# Patient Record
Sex: Male | Born: 1980 | Race: Black or African American | Hispanic: No | Marital: Single | Smoking: Current every day smoker
Health system: Southern US, Community
[De-identification: ages and names within clinical notes are randomized; demographics above are authoritative.]

## PROBLEM LIST (undated history)

## (undated) DIAGNOSIS — S86019A Strain of unspecified Achilles tendon, initial encounter: Secondary | ICD-10-CM

## (undated) HISTORY — PX: NO PAST SURGERIES: SHX2092

---

## 2001-12-03 ENCOUNTER — Encounter: Admission: RE | Admit: 2001-12-03 | Discharge: 2001-12-03 | Payer: Self-pay | Admitting: Internal Medicine

## 2001-12-10 ENCOUNTER — Encounter: Admission: RE | Admit: 2001-12-10 | Discharge: 2001-12-10 | Payer: Self-pay | Admitting: Internal Medicine

## 2001-12-10 ENCOUNTER — Ambulatory Visit (HOSPITAL_COMMUNITY): Admission: RE | Admit: 2001-12-10 | Discharge: 2001-12-10 | Payer: Self-pay | Admitting: Internal Medicine

## 2001-12-16 ENCOUNTER — Encounter: Admission: RE | Admit: 2001-12-16 | Discharge: 2001-12-16 | Payer: Self-pay | Admitting: Infectious Diseases

## 2003-01-28 ENCOUNTER — Emergency Department (HOSPITAL_COMMUNITY): Admission: EM | Admit: 2003-01-28 | Discharge: 2003-01-28 | Payer: Self-pay | Admitting: Emergency Medicine

## 2003-08-14 ENCOUNTER — Emergency Department (HOSPITAL_COMMUNITY): Admission: EM | Admit: 2003-08-14 | Discharge: 2003-08-14 | Payer: Self-pay | Admitting: Emergency Medicine

## 2005-01-28 IMAGING — CR DG CERVICAL SPINE COMPLETE 4+V
5 series · 5 of 5 positions shown · non-contrast
Comparison: none

CLINICAL DATA: pain
 CERVICAL SPINE COMPLETE
 There is no evidence of fracture or prevertebral soft tissue swelling. Alignment is normal. The intervertebral disk spaces are within normal limits and no other significant bone abnormalities are identified.

 IMPRESSION
 Negative cervical spine radiographs.

[view not recorded (1 of 5)]
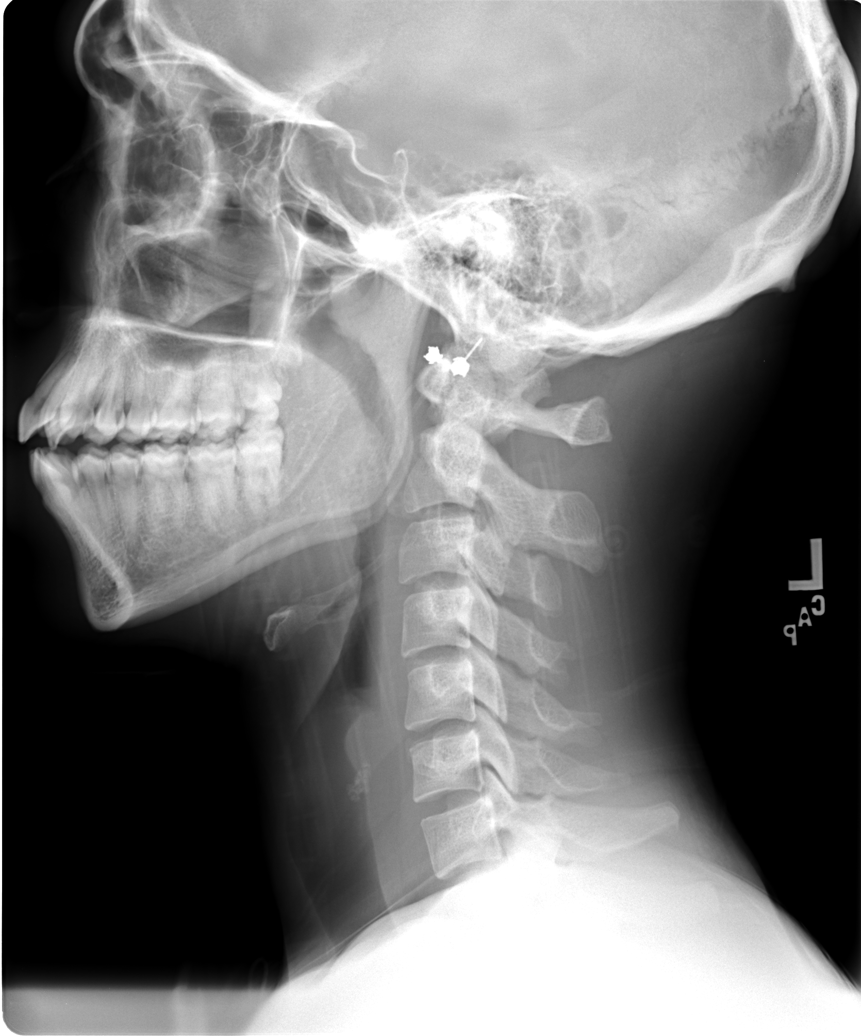

[view not recorded (2 of 5)]
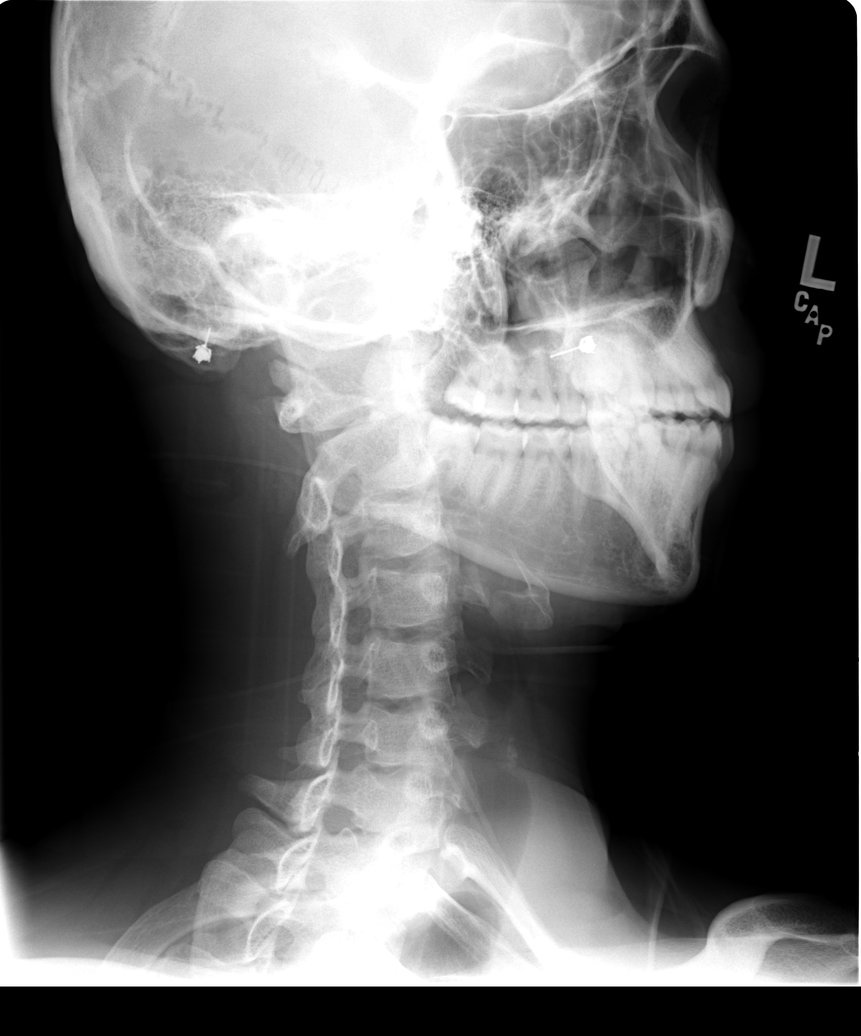

[view not recorded (3 of 5)]
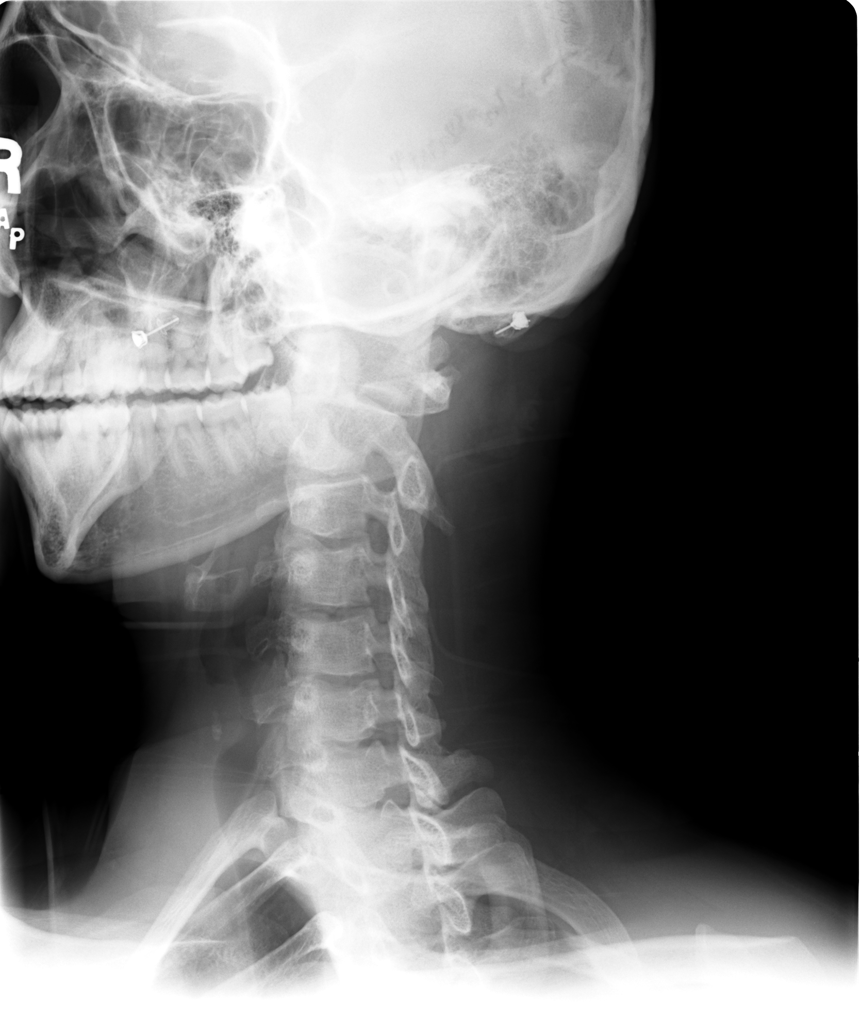

[view not recorded (4 of 5)]
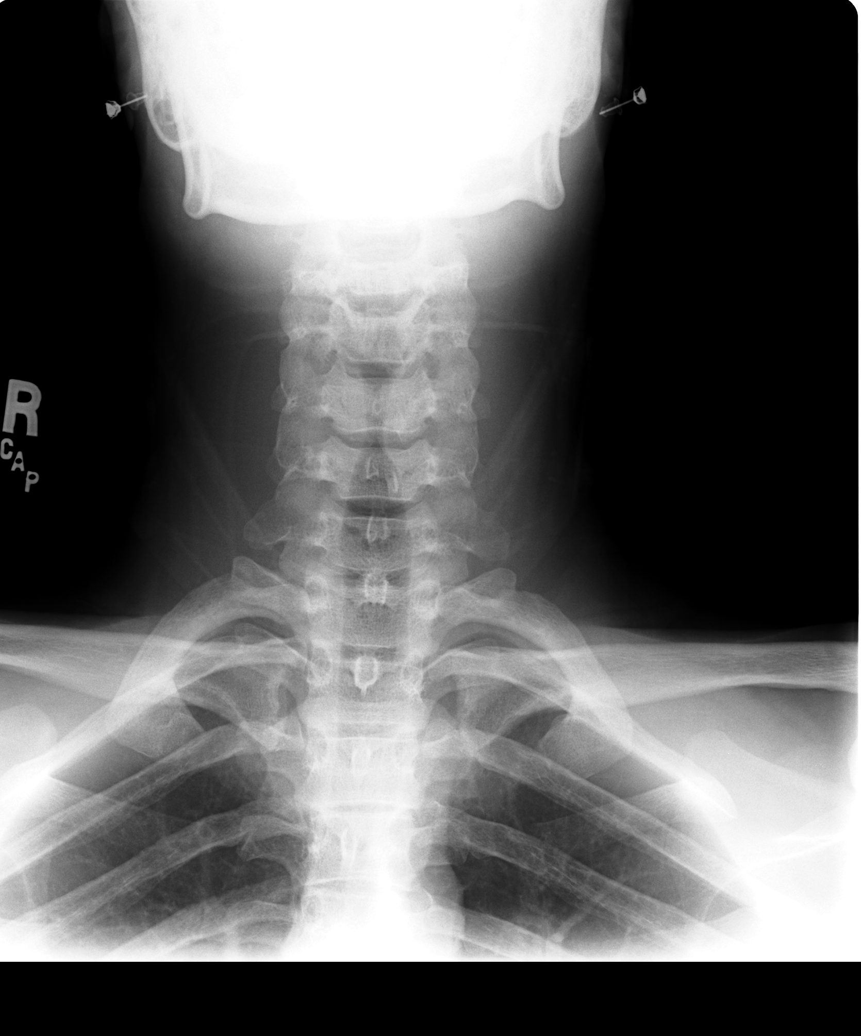

[view not recorded (5 of 5)]
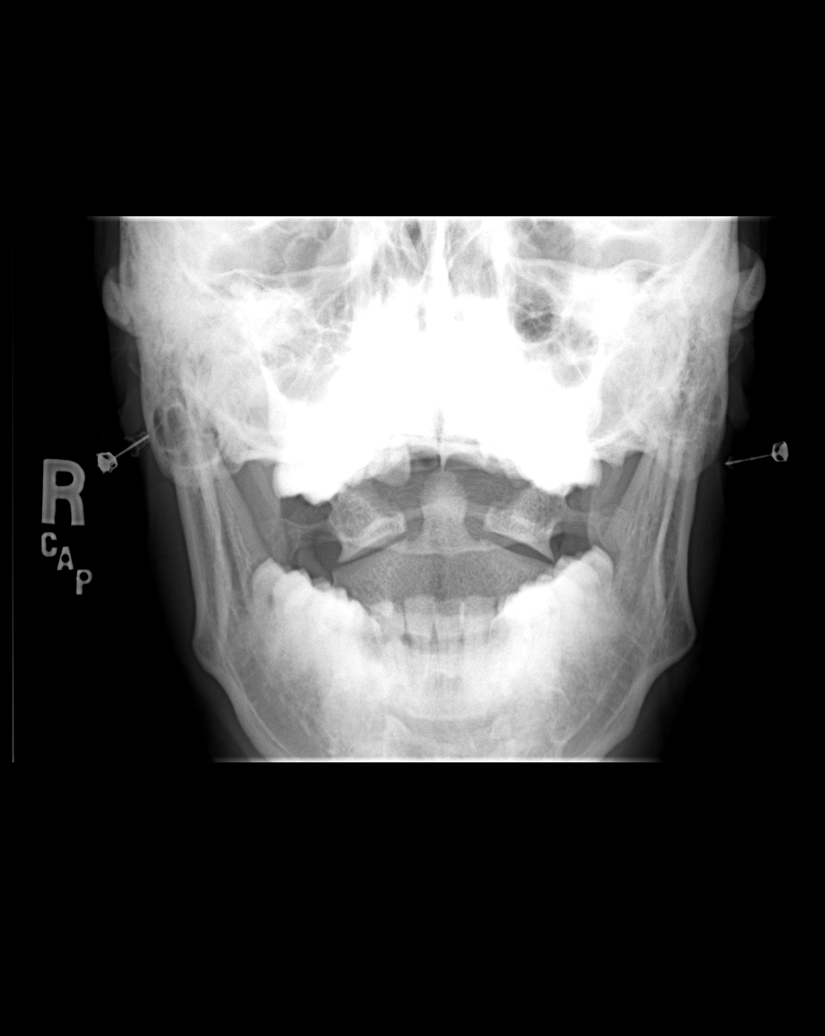

[5 of 5 positions shown; findings below may reference images not displayed]

## 2006-08-16 ENCOUNTER — Emergency Department (HOSPITAL_COMMUNITY): Admission: EM | Admit: 2006-08-16 | Discharge: 2006-08-16 | Payer: Self-pay | Admitting: Emergency Medicine

## 2007-10-24 ENCOUNTER — Emergency Department (HOSPITAL_COMMUNITY): Admission: EM | Admit: 2007-10-24 | Discharge: 2007-10-24 | Payer: Self-pay | Admitting: Emergency Medicine

## 2008-07-16 ENCOUNTER — Emergency Department (HOSPITAL_COMMUNITY): Admission: EM | Admit: 2008-07-16 | Discharge: 2008-07-16 | Payer: Self-pay | Admitting: Family Medicine

## 2010-01-18 ENCOUNTER — Emergency Department (HOSPITAL_COMMUNITY)
Admission: EM | Admit: 2010-01-18 | Discharge: 2010-01-18 | Payer: Self-pay | Source: Home / Self Care | Admitting: Family Medicine

## 2010-04-02 LAB — GC/CHLAMYDIA PROBE AMP, GENITAL
Chlamydia, DNA Probe: NEGATIVE
GC Probe Amp, Genital: POSITIVE — AB

## 2010-04-30 LAB — GC/CHLAMYDIA PROBE AMP, GENITAL
Chlamydia, DNA Probe: NEGATIVE
GC Probe Amp, Genital: POSITIVE — AB

## 2010-04-30 LAB — RPR: RPR Ser Ql: NONREACTIVE

## 2010-04-30 LAB — HIV ANTIBODY (ROUTINE TESTING W REFLEX): HIV: NONREACTIVE

## 2010-10-29 ENCOUNTER — Inpatient Hospital Stay (INDEPENDENT_AMBULATORY_CARE_PROVIDER_SITE_OTHER)
Admission: RE | Admit: 2010-10-29 | Discharge: 2010-10-29 | Disposition: A | Discharge status: Home / Self Care | Payer: Self-pay | Source: Ambulatory Visit | Attending: Family Medicine | Admitting: Family Medicine

## 2010-10-29 DIAGNOSIS — IMO0002 Reserved for concepts with insufficient information to code with codable children: Secondary | ICD-10-CM

## 2012-04-27 ENCOUNTER — Other Ambulatory Visit (HOSPITAL_COMMUNITY)
Admission: RE | Admit: 2012-04-27 | Discharge: 2012-04-27 | Disposition: A | Discharge status: Home / Self Care | Payer: Self-pay | Source: Ambulatory Visit | Attending: Emergency Medicine | Admitting: Emergency Medicine

## 2012-04-27 ENCOUNTER — Encounter (HOSPITAL_COMMUNITY): Payer: Self-pay | Admitting: *Deleted

## 2012-04-27 ENCOUNTER — Emergency Department (INDEPENDENT_AMBULATORY_CARE_PROVIDER_SITE_OTHER)
Admission: EM | Admit: 2012-04-27 | Discharge: 2012-04-27 | Disposition: A | Discharge status: Home / Self Care | Payer: Self-pay | Source: Home / Self Care | Attending: Family Medicine | Admitting: Family Medicine

## 2012-04-27 DIAGNOSIS — F17200 Nicotine dependence, unspecified, uncomplicated: Secondary | ICD-10-CM

## 2012-04-27 DIAGNOSIS — Z113 Encounter for screening for infections with a predominantly sexual mode of transmission: Secondary | ICD-10-CM | POA: Insufficient documentation

## 2012-04-27 DIAGNOSIS — Z720 Tobacco use: Secondary | ICD-10-CM

## 2012-04-27 DIAGNOSIS — Z202 Contact with and (suspected) exposure to infections with a predominantly sexual mode of transmission: Secondary | ICD-10-CM

## 2012-04-27 MED ORDER — METRONIDAZOLE 500 MG PO TABS
2000.0000 mg | ORAL_TABLET | Freq: Once | ORAL | Status: AC
  Administered 2012-04-27: 2000 mg via ORAL

## 2012-04-27 MED ORDER — METRONIDAZOLE 500 MG PO TABS
ORAL_TABLET | ORAL | Status: AC
  Filled 2012-04-27: qty 4

## 2012-04-27 NOTE — ED Provider Notes (Signed)
History     CSN: [id number]  Arrival date & time 04/27/12  1458   First MD Initiated Contact with Patient 04/27/12 1708      Chief Complaint  Patient presents with  . Exposure to STD    (Consider location/radiation/quality/duration/timing/severity/associated sxs/prior treatment) HPI Comments: Pt denies any sx. Expresses interest in smoking cessation  Patient is a 31 y.o. male presenting with STD exposure. The history is provided by the patient.  Exposure to STD This is a new problem. Episode onset: girlfriend found out 3 days ago she was positive for trich. The problem occurs constantly. The problem has not changed since onset.Pertinent negatives include no abdominal pain. Nothing aggravates the symptoms. Nothing relieves the symptoms. He has tried nothing for the symptoms.    History reviewed. No pertinent past medical history.  History reviewed. No pertinent past surgical history.  History reviewed. No pertinent family history.  History  Substance Use Topics  . Smoking status: Current Every Day Smoker -- 0.25 packs/day    Types: Cigarettes  . Smokeless tobacco: Not on file  . Alcohol Use: No      Review of Systems  Constitutional: Negative for fever and chills.  Gastrointestinal: Negative for abdominal pain.  Genitourinary: Negative for dysuria, discharge, genital sores and penile pain.    Allergies  Review of patient's allergies indicates no known allergies.  Home Medications  No current outpatient prescriptions on file.  BP 150/82  Pulse 69  Temp(Src) 98.2 F (36.8 C) (Oral)  Resp 16  SpO2 99%  Physical Exam  Constitutional: He appears well-developed and well-nourished. No distress.  Pulmonary/Chest: Effort normal.    ED Course  Procedures (including critical care time)  Labs Reviewed  URINE CYTOLOGY ANCILLARY ONLY   No results found.   1. Trichomonas exposure   2. Smoking trying to quit       MDM  Exam deferred as pt not symptomatic.   Given one time trich dose of flagyl.  Given smoking cessation info.          Cathlyn Parsons, NP 04/27/12 1715

## 2012-04-27 NOTE — ED Notes (Signed)
Girlfriend was notified that she had [place] on Fri.  She has been treated.  No symptoms.  Had another partner in Nov.  To busy here on Sat. line out the door.

## 2012-04-29 NOTE — ED Provider Notes (Signed)
Medical screening examination/treatment/procedure(s) were performed by resident physician or non-physician practitioner and as supervising physician I was immediately available for consultation/collaboration.   Laurence Crofford DOUGLAS MD.   Zohaib Heeney D Acey Woodfield, MD 04/29/12 1524 

## 2013-05-10 ENCOUNTER — Emergency Department (HOSPITAL_COMMUNITY)
Admission: EM | Admit: 2013-05-10 | Discharge: 2013-05-10 | Discharge status: Absent without leave | Payer: Self-pay | Source: Home / Self Care

## 2013-08-21 DIAGNOSIS — S86019A Strain of unspecified Achilles tendon, initial encounter: Secondary | ICD-10-CM

## 2013-08-21 HISTORY — DX: Strain of unspecified achilles tendon, initial encounter: S86.019A

## 2013-08-23 ENCOUNTER — Emergency Department (HOSPITAL_COMMUNITY)
Admission: EM | Admit: 2013-08-23 | Discharge: 2013-08-23 | Disposition: A | Discharge status: Home / Self Care | Payer: Self-pay | Attending: Emergency Medicine | Admitting: Emergency Medicine

## 2013-08-23 ENCOUNTER — Encounter (HOSPITAL_COMMUNITY): Payer: Self-pay | Admitting: Emergency Medicine

## 2013-08-23 DIAGNOSIS — Y92838 Other recreation area as the place of occurrence of the external cause: Secondary | ICD-10-CM

## 2013-08-23 DIAGNOSIS — S86002A Unspecified injury of left Achilles tendon, initial encounter: Secondary | ICD-10-CM

## 2013-08-23 DIAGNOSIS — S96819A Strain of other specified muscles and tendons at ankle and foot level, unspecified foot, initial encounter: Principal | ICD-10-CM

## 2013-08-23 DIAGNOSIS — W219XXA Striking against or struck by unspecified sports equipment, initial encounter: Secondary | ICD-10-CM | POA: Insufficient documentation

## 2013-08-23 DIAGNOSIS — S93499A Sprain of other ligament of unspecified ankle, initial encounter: Secondary | ICD-10-CM | POA: Insufficient documentation

## 2013-08-23 DIAGNOSIS — F17200 Nicotine dependence, unspecified, uncomplicated: Secondary | ICD-10-CM | POA: Insufficient documentation

## 2013-08-23 DIAGNOSIS — S99929A Unspecified injury of unspecified foot, initial encounter: Secondary | ICD-10-CM

## 2013-08-23 DIAGNOSIS — Y9367 Activity, basketball: Secondary | ICD-10-CM | POA: Insufficient documentation

## 2013-08-23 DIAGNOSIS — S99919A Unspecified injury of unspecified ankle, initial encounter: Secondary | ICD-10-CM

## 2013-08-23 DIAGNOSIS — Y9239 Other specified sports and athletic area as the place of occurrence of the external cause: Secondary | ICD-10-CM | POA: Insufficient documentation

## 2013-08-23 DIAGNOSIS — S8990XA Unspecified injury of unspecified lower leg, initial encounter: Secondary | ICD-10-CM | POA: Insufficient documentation

## 2013-08-23 MED ORDER — IBUPROFEN 600 MG PO TABS: 600.0000 mg | ORAL_TABLET | Freq: Four times a day (QID) | ORAL | Status: DC | PRN

## 2013-08-23 MED ORDER — OXYCODONE-ACETAMINOPHEN 5-325 MG PO TABS
1.0000 | ORAL_TABLET | Freq: Once | ORAL | Status: AC
  Administered 2013-08-23: 1 via ORAL
  Filled 2013-08-23: qty 1

## 2013-08-23 MED ORDER — HYDROCODONE-ACETAMINOPHEN 5-325 MG PO TABS: ORAL_TABLET | ORAL | Status: DC

## 2013-08-23 NOTE — ED Provider Notes (Signed)
Medical screening examination/treatment/procedure(s) were performed by non-physician practitioner and as supervising physician I was immediately available for consultation/collaboration.  Rakiya Krawczyk L Burak Zerbe, MD 08/23/13 1703 

## 2013-08-23 NOTE — Discharge Instructions (Signed)
Please read and follow all provided instructions.  Your diagnoses today include:  1. Achilles tendon injury, left, initial encounter     Tests performed today include:  Vital signs. See below for your results today.   Medications prescribed:   Vicodin (hydrocodone/acetaminophen) - narcotic pain medication  DO NOT drive or perform any activities that require you to be awake and alert because this medicine can make you drowsy. BE VERY CAREFUL not to take multiple medicines containing Tylenol (also called acetaminophen). Doing so can lead to an overdose which can damage your liver and cause liver failure and possibly death.   Ibuprofen (Motrin, Advil) - anti-inflammatory pain medication  Do not exceed 600mg  ibuprofen every 6 hours, take with food  You have been prescribed an anti-inflammatory medication or NSAID. Take with food. Take smallest effective dose for the shortest duration needed for your pain. Stop taking if you experience stomach pain or vomiting.   Take any prescribed medications only as directed.  Home care instructions:   Follow any educational materials contained in this packet  Use crutches and splint at all times  Follow R.I.C.E. Protocol:  R - rest your injury   I  - use ice on injury without applying directly to skin  C - compress injury with bandage or splint  E - elevate the injury as much as possible  Follow-up instructions: Please follow-up with the provided orthopedic physician (bone specialist) this week.   Return instructions:   Please return if your toes are numb or tingling, appear gray or blue, or you have severe pain (also elevate leg and loosen splint or wrap if you were given one)  Please return to the Emergency Department if you experience worsening symptoms.   Please return if you have any other emergent concerns.  Additional Information:  Your vital signs today were: BP 118/63   Pulse 60   Temp(Src) 97.6 F (36.4 C) (Oral)    Resp 20   SpO2 99% If your blood pressure (BP) was elevated above 135/85 this visit, please have this repeated by your doctor within one month. -------------- If prescribed crutches for your injury: use crutches with non-weight bearing for the first few days. Then, you may walk as the pain allows, or as instructed. Start gradually with weight bearing on the affected side. Once you can walk pain free, then try jogging. When you can run forwards, then you can try moving side-to-side. If you cannot walk without crutches in one week, you need a re-check. --------------

## 2013-08-23 NOTE — ED Notes (Signed)
Declined W/C at D/C and was escorted to lobby by RN. 

## 2013-08-23 NOTE — ED Provider Notes (Signed)
CSN: [id number]     Arrival date & time 08/23/13  0840 History  This chart was scribed for non-physician practitioner, Rhea BleacherJosh Valora Norell, PA-C working with Flint MelterElliott L Wentz, MD, by Jarvis Morganaylor Ferguson, ED Scribe. This patient was seen in room TR07C/TR07C and the patient's care was started at 9:09 AM.     Chief Complaint  Patient presents with  . Leg Pain     The history is provided by the patient. No language interpreter was used.   HPI Comments: [name] is a 33 y.o. male who presents to the Emergency Department complaining of pain to left lower leg that began 2 days ago. Patient states he was playing basketball when he feels like he possibly injured it.  He states that another played ran into his heel. Pain began acutely. Patient states that the pain radiates from his left lower heel up to his left knee. He notes some associated tingling in the left achilles and mild swelling. Patient states that the pain is exacerbated by ambulating and movement. He has been using crutches since Saturday and states he has been unable to walk. He denies any numbness to the area.   History reviewed. No pertinent past medical history. History reviewed. No pertinent past surgical history. History reviewed. No pertinent family history. History  Substance Use Topics  . Smoking status: Current Every Day Smoker -- 0.25 packs/day    Types: Cigarettes  . Smokeless tobacco: Never Used  . Alcohol Use: No    Review of Systems  Constitutional: Negative for activity change.  Musculoskeletal: Positive for arthralgias, gait problem (unable to walk, walking with crutches), joint swelling ("from left heel up to his left knee") and myalgias (left heel pain). Negative for back pain and neck pain.  Skin: Negative for wound.  Neurological: Negative for weakness and numbness.  All other systems reviewed and are negative.     Allergies  Review of patient's allergies indicates no known allergies.  Home Medications    Prior to Admission medications   Medication Sig Start Date End Date Taking? Authorizing Provider  ibuprofen (ADVIL,MOTRIN) 200 MG tablet Take 600 mg by mouth every 6 (six) hours as needed for moderate pain.   Yes Historical Provider, MD   Triage Vitals: BP 118/63  Pulse 60  Temp(Src) 97.6 F (36.4 C) (Oral)  Resp 20  SpO2 99%  Physical Exam  Nursing note and vitals reviewed. Constitutional: He appears well-developed and well-nourished. No distress.  HENT:  Head: Normocephalic and atraumatic.  Eyes: Conjunctivae and EOM are normal.  Neck: Neck supple. No tracheal deviation present.  Cardiovascular: Normal rate.   Pulmonary/Chest: Effort normal. No respiratory distress.  Musculoskeletal:       Left hip: Normal.       Left knee: Normal.       Left ankle: He exhibits decreased range of motion and swelling. He exhibits no deformity, no laceration and normal pulse. Tenderness. Achilles tendon exhibits pain and abnormal Thompson's test results.       Left lower leg: He exhibits tenderness (over achilles tendon) and swelling. He exhibits no bony tenderness.       Left foot: He exhibits decreased range of motion. He exhibits no tenderness and no bony tenderness.  Skin: Skin is warm and dry.  Psychiatric: He has a normal mood and affect. His behavior is normal.    ED Course  Procedures (including critical care time)  DIAGNOSTIC STUDIES: Oxygen Saturation is 99% on RA, normal by my interpretation.  COORDINATION OF CARE: 9:15 AM- Will order splint application. Pt advised of plan for treatment and pt agrees. Ortho f/u given.    Labs Review Labs Reviewed - No data to display  Imaging Review No results found.   EKG Interpretation None      Vital signs reviewed and are as follows: Filed Vitals:   08/23/13 0851  BP: 118/63  Pulse: 60  Temp: 97.6 F (36.4 C)  Resp: 20   Patient was counseled on RICE protocol and told to rest injury, use ice for no longer than 15  minutes every hour, compress the area, and elevate above the level of their heart as much as possible to reduce swelling. Questions answered. Patient verbalized understanding.    Patient counseled on use of narcotic pain medications. Counseled not to combine these medications with others containing tylenol. Urged not to drink alcohol, drive, or perform any other activities that requires focus while taking these medications. The patient verbalizes understanding and agrees with the plan.  Patient instructed to use crutches and keep splint in place until followup with orthopedist.  MDM   Final diagnoses:  Achilles tendon injury, left, initial encounter   Left Achilles disruption suspected given exam. Pain control indicated. Extremities neurovascularly intact. Orthopedic followup will be needed.  I personally performed the services described in this documentation, which was scribed in my presence. The recorded information has been reviewed and is accurate.     Renne Crigler, PA-C 08/23/13 1000

## 2013-08-23 NOTE — Progress Notes (Signed)
[facility] Coca-[employer]aLiaison Stacy,  Did not get to see patient but will be sending information about [facility] to help patient establish primary care, to address provided.

## 2013-08-23 NOTE — Progress Notes (Signed)
Orthopedic Tech Progress Note Patient Details:  [name] 02/15/1980 [id number]  Ortho Devices Type of Ortho Device: Ace wrap;Post (short leg) splint Ortho Device/Splint Interventions: Application   Cammer, Mickie BailJennifer Carol 08/23/2013, 9:55 AM

## 2013-08-23 NOTE — ED Notes (Signed)
Pt reports pain started on SAt. While playing basket ball. Pt reports pain to LT lower leg near heal.

## 2013-08-23 NOTE — ED Notes (Signed)
Ortho called for splint  

## 2013-08-26 ENCOUNTER — Encounter (HOSPITAL_BASED_OUTPATIENT_CLINIC_OR_DEPARTMENT_OTHER): Payer: Self-pay | Admitting: *Deleted

## 2013-08-27 ENCOUNTER — Ambulatory Visit (HOSPITAL_BASED_OUTPATIENT_CLINIC_OR_DEPARTMENT_OTHER)
Admission: RE | Admit: 2013-08-27 | Discharge: 2013-08-27 | Disposition: A | Discharge status: Home / Self Care | Payer: Self-pay | Source: Ambulatory Visit | Attending: Orthopedic Surgery | Admitting: Orthopedic Surgery

## 2013-08-27 ENCOUNTER — Encounter (HOSPITAL_BASED_OUTPATIENT_CLINIC_OR_DEPARTMENT_OTHER)
Admission: RE | Disposition: A | Discharge status: Home / Self Care | Payer: Self-pay | Source: Ambulatory Visit | Attending: Orthopedic Surgery

## 2013-08-27 ENCOUNTER — Encounter (HOSPITAL_BASED_OUTPATIENT_CLINIC_OR_DEPARTMENT_OTHER): Payer: Self-pay | Admitting: *Deleted

## 2013-08-27 ENCOUNTER — Encounter (HOSPITAL_BASED_OUTPATIENT_CLINIC_OR_DEPARTMENT_OTHER): Payer: Self-pay | Admitting: Anesthesiology

## 2013-08-27 ENCOUNTER — Ambulatory Visit (HOSPITAL_BASED_OUTPATIENT_CLINIC_OR_DEPARTMENT_OTHER): Payer: Self-pay | Admitting: Anesthesiology

## 2013-08-27 DIAGNOSIS — Y929 Unspecified place or not applicable: Secondary | ICD-10-CM | POA: Insufficient documentation

## 2013-08-27 DIAGNOSIS — S93499A Sprain of other ligament of unspecified ankle, initial encounter: Secondary | ICD-10-CM | POA: Insufficient documentation

## 2013-08-27 DIAGNOSIS — S96819A Strain of other specified muscles and tendons at ankle and foot level, unspecified foot, initial encounter: Principal | ICD-10-CM

## 2013-08-27 DIAGNOSIS — Y999 Unspecified external cause status: Secondary | ICD-10-CM | POA: Insufficient documentation

## 2013-08-27 DIAGNOSIS — F17200 Nicotine dependence, unspecified, uncomplicated: Secondary | ICD-10-CM | POA: Insufficient documentation

## 2013-08-27 DIAGNOSIS — X500XXA Overexertion from strenuous movement or load, initial encounter: Secondary | ICD-10-CM | POA: Insufficient documentation

## 2013-08-27 DIAGNOSIS — Y9367 Activity, basketball: Secondary | ICD-10-CM | POA: Insufficient documentation

## 2013-08-27 HISTORY — PX: ACHILLES TENDON SURGERY: SHX542

## 2013-08-27 HISTORY — DX: Strain of unspecified achilles tendon, initial encounter: S86.019A

## 2013-08-27 LAB — POCT HEMOGLOBIN-HEMACUE: HEMOGLOBIN: 14.1 g/dL (ref 13.0–17.0)

## 2013-08-27 SURGERY — REPAIR, TENDON, ACHILLES
Anesthesia: General | Site: Foot | Laterality: Left

## 2013-08-27 MED ORDER — BUPIVACAINE HCL (PF) 0.5 % IJ SOLN
INTRAMUSCULAR | Status: AC
  Filled 2013-08-27: qty 30

## 2013-08-27 MED ORDER — FENTANYL CITRATE 0.05 MG/ML IJ SOLN
INTRAMUSCULAR | Status: DC | PRN
  Administered 2013-08-27: 100 ug via INTRAVENOUS

## 2013-08-27 MED ORDER — FENTANYL CITRATE 0.05 MG/ML IJ SOLN
INTRAMUSCULAR | Status: AC
  Filled 2013-08-27: qty 6

## 2013-08-27 MED ORDER — FENTANYL CITRATE 0.05 MG/ML IJ SOLN
50.0000 ug | INTRAMUSCULAR | Status: DC | PRN
  Administered 2013-08-27: 100 ug via INTRAVENOUS

## 2013-08-27 MED ORDER — HYDROMORPHONE HCL PF 1 MG/ML IJ SOLN: 0.2500 mg | INTRAMUSCULAR | Status: DC | PRN

## 2013-08-27 MED ORDER — MIDAZOLAM HCL 2 MG/2ML IJ SOLN
1.0000 mg | INTRAMUSCULAR | Status: DC | PRN
  Administered 2013-08-27: 2 mg via INTRAVENOUS

## 2013-08-27 MED ORDER — OXYCODONE HCL 5 MG/5ML PO SOLN: 5.0000 mg | Freq: Once | ORAL | Status: DC | PRN

## 2013-08-27 MED ORDER — MEPERIDINE HCL 25 MG/ML IJ SOLN: 6.2500 mg | INTRAMUSCULAR | Status: DC | PRN

## 2013-08-27 MED ORDER — LIDOCAINE HCL (CARDIAC) 10 MG/ML IV SOLN
INTRAVENOUS | Status: DC | PRN
  Administered 2013-08-27: 100 mg via INTRAVENOUS

## 2013-08-27 MED ORDER — MIDAZOLAM HCL 2 MG/ML PO SYRP: 12.0000 mg | ORAL_SOLUTION | Freq: Once | ORAL | Status: DC | PRN

## 2013-08-27 MED ORDER — ONDANSETRON HCL 4 MG/2ML IJ SOLN
INTRAMUSCULAR | Status: AC
  Filled 2013-08-27: qty 2

## 2013-08-27 MED ORDER — MIDAZOLAM HCL 5 MG/5ML IJ SOLN
INTRAMUSCULAR | Status: DC | PRN
  Administered 2013-08-27 (×2): 1 mg via INTRAVENOUS

## 2013-08-27 MED ORDER — OXYCODONE HCL 5 MG PO TABS: 10.0000 mg | ORAL_TABLET | ORAL | Status: DC | PRN

## 2013-08-27 MED ORDER — DOCUSATE SODIUM 100 MG PO CAPS: 100.0000 mg | ORAL_CAPSULE | Freq: Two times a day (BID) | ORAL | Status: DC

## 2013-08-27 MED ORDER — CEFAZOLIN SODIUM-DEXTROSE 2-3 GM-% IV SOLR
2.0000 g | INTRAVENOUS | Status: AC
  Administered 2013-08-27: 2 g via INTRAVENOUS

## 2013-08-27 MED ORDER — LIDOCAINE-EPINEPHRINE (PF) 1.5 %-1:200000 IJ SOLN
INTRAMUSCULAR | Status: DC | PRN
  Administered 2013-08-27: 30 mL via PERINEURAL

## 2013-08-27 MED ORDER — ACETAMINOPHEN 500 MG PO TABS
ORAL_TABLET | ORAL | Status: AC
  Filled 2013-08-27: qty 2

## 2013-08-27 MED ORDER — SUCCINYLCHOLINE CHLORIDE 20 MG/ML IJ SOLN
INTRAMUSCULAR | Status: DC | PRN
  Administered 2013-08-27: 100 mg via INTRAVENOUS

## 2013-08-27 MED ORDER — ONDANSETRON HCL 4 MG PO TABS: 4.0000 mg | ORAL_TABLET | Freq: Three times a day (TID) | ORAL | Status: DC | PRN

## 2013-08-27 MED ORDER — ACETAMINOPHEN 500 MG PO TABS
1000.0000 mg | ORAL_TABLET | Freq: Once | ORAL | Status: AC
  Administered 2013-08-27: 1000 mg via ORAL

## 2013-08-27 MED ORDER — BUPIVACAINE HCL (PF) 0.5 % IJ SOLN
INTRAMUSCULAR | Status: DC | PRN
  Administered 2013-08-27: 10 mL

## 2013-08-27 MED ORDER — ONDANSETRON HCL 4 MG/2ML IJ SOLN: 4.0000 mg | Freq: Once | INTRAMUSCULAR | Status: DC | PRN

## 2013-08-27 MED ORDER — MIDAZOLAM HCL 2 MG/2ML IJ SOLN
INTRAMUSCULAR | Status: AC
  Filled 2013-08-27: qty 2

## 2013-08-27 MED ORDER — BUPIVACAINE-EPINEPHRINE (PF) 0.5% -1:200000 IJ SOLN
INTRAMUSCULAR | Status: DC | PRN
  Administered 2013-08-27: 30 mL via PERINEURAL

## 2013-08-27 MED ORDER — ONDANSETRON HCL 4 MG/2ML IJ SOLN
4.0000 mg | Freq: Once | INTRAMUSCULAR | Status: AC
  Administered 2013-08-27: 4 mg via INTRAVENOUS

## 2013-08-27 MED ORDER — PROPOFOL 10 MG/ML IV BOLUS
INTRAVENOUS | Status: DC | PRN
  Administered 2013-08-27: 150 mg via INTRAVENOUS

## 2013-08-27 MED ORDER — LACTATED RINGERS IV SOLN
INTRAVENOUS | Status: DC
  Administered 2013-08-27 (×2): via INTRAVENOUS

## 2013-08-27 MED ORDER — CEFAZOLIN SODIUM-DEXTROSE 2-3 GM-% IV SOLR
INTRAVENOUS | Status: AC
  Filled 2013-08-27: qty 50

## 2013-08-27 MED ORDER — ASPIRIN 81 MG PO TABS: 81.0000 mg | ORAL_TABLET | Freq: Every day | ORAL | Status: DC

## 2013-08-27 MED ORDER — DEXTROSE-NACL 5-0.45 % IV SOLN: 100.0000 mL/h | INTRAVENOUS | Status: DC

## 2013-08-27 MED ORDER — FENTANYL CITRATE 0.05 MG/ML IJ SOLN
INTRAMUSCULAR | Status: AC
  Filled 2013-08-27: qty 2

## 2013-08-27 MED ORDER — GLYCOPYRROLATE 0.2 MG/ML IJ SOLN
INTRAMUSCULAR | Status: DC | PRN
  Administered 2013-08-27: 0.2 mg via INTRAVENOUS

## 2013-08-27 MED ORDER — OXYCODONE HCL 5 MG PO TABS: 5.0000 mg | ORAL_TABLET | Freq: Once | ORAL | Status: DC | PRN

## 2013-08-27 SURGICAL SUPPLY — 59 items
BANDAGE ELASTIC 4 VELCRO ST LF (GAUZE/BANDAGES/DRESSINGS) ×3 IMPLANT
BANDAGE ELASTIC 6 VELCRO ST LF (GAUZE/BANDAGES/DRESSINGS) ×3 IMPLANT
BANDAGE ESMARK 6X9 LF (GAUZE/BANDAGES/DRESSINGS) ×1 IMPLANT
BLADE SURG 15 STRL LF DISP TIS (BLADE) ×2 IMPLANT
BLADE SURG 15 STRL SS (BLADE) ×6
BNDG CMPR 9X6 STRL LF SNTH (GAUZE/BANDAGES/DRESSINGS) ×1
BNDG COHESIVE 4X5 TAN STRL (GAUZE/BANDAGES/DRESSINGS) ×2 IMPLANT
BNDG ESMARK 6X9 LF (GAUZE/BANDAGES/DRESSINGS) ×3
CHLORAPREP W/TINT 26ML (MISCELLANEOUS) ×3 IMPLANT
CLOSURE WOUND 1/2 X4 (GAUZE/BANDAGES/DRESSINGS) ×1
COVER MAYO STAND STRL (DRAPES) ×3 IMPLANT
COVER TABLE BACK 60X90 (DRAPES) ×3 IMPLANT
CUFF TOURNIQUET SINGLE 24IN (TOURNIQUET CUFF) IMPLANT
CUFF TOURNIQUET SINGLE 34IN LL (TOURNIQUET CUFF) ×3 IMPLANT
DECANTER SPIKE VIAL GLASS SM (MISCELLANEOUS) IMPLANT
DRAPE EXTREMITY T 121X128X90 (DRAPE) ×3 IMPLANT
DRAPE U 20/CS (DRAPES) ×3 IMPLANT
DRAPE U-SHAPE 47X51 STRL (DRAPES) ×3 IMPLANT
DRSG EMULSION OIL 3X3 NADH (GAUZE/BANDAGES/DRESSINGS) ×1 IMPLANT
ELECT REM PT RETURN 9FT ADLT (ELECTROSURGICAL) ×3
ELECTRODE REM PT RTRN 9FT ADLT (ELECTROSURGICAL) ×1 IMPLANT
GAUZE SPONGE 4X4 12PLY STRL (GAUZE/BANDAGES/DRESSINGS) ×3 IMPLANT
GLOVE BIO SURGEON STRL SZ7.5 (GLOVE) ×3 IMPLANT
GLOVE BIO SURGEON STRL SZ8 (GLOVE) ×2 IMPLANT
GLOVE BIOGEL PI IND STRL 8 (GLOVE) ×1 IMPLANT
GLOVE BIOGEL PI INDICATOR 8 (GLOVE) ×2
GLOVE EXAM NITRILE MD LF STRL (GLOVE) ×3 IMPLANT
GLOVE SURG SS PI 7.0 STRL IVOR (GLOVE) ×4 IMPLANT
GOWN STRL REUS W/ TWL LRG LVL3 (GOWN DISPOSABLE) ×2 IMPLANT
GOWN STRL REUS W/ TWL XL LVL3 (GOWN DISPOSABLE) ×1 IMPLANT
GOWN STRL REUS W/TWL LRG LVL3 (GOWN DISPOSABLE) ×6
GOWN STRL REUS W/TWL XL LVL3 (GOWN DISPOSABLE) ×3
NEEDLE HYPO 22GX1.5 SAFETY (NEEDLE) ×2 IMPLANT
NS IRRIG 1000ML POUR BTL (IV SOLUTION) ×3 IMPLANT
PACK BASIN DAY SURGERY FS (CUSTOM PROCEDURE TRAY) ×3 IMPLANT
PAD CAST 4YDX4 CTTN HI CHSV (CAST SUPPLIES) ×1 IMPLANT
PADDING CAST COTTON 4X4 STRL (CAST SUPPLIES) ×3
PADDING CAST COTTON 6X4 STRL (CAST SUPPLIES) ×3 IMPLANT
PENCIL BUTTON HOLSTER BLD 10FT (ELECTRODE) ×3 IMPLANT
SLEEVE SCD COMPRESS KNEE MED (MISCELLANEOUS) IMPLANT
SPLINT FAST PLASTER 5X30 (CAST SUPPLIES) ×30
SPLINT PLASTER CAST FAST 5X30 (CAST SUPPLIES) IMPLANT
SPONGE LAP 4X18 X RAY DECT (DISPOSABLE) ×3 IMPLANT
STRIP CLOSURE SKIN 1/2X4 (GAUZE/BANDAGES/DRESSINGS) ×2 IMPLANT
SUCTION FRAZIER TIP 10 FR DISP (SUCTIONS) IMPLANT
SUT FIBERWIRE #2 38 T-5 BLUE (SUTURE) ×6
SUT MON AB 2-0 CT1 36 (SUTURE) ×3 IMPLANT
SUT MON AB 4-0 PC3 18 (SUTURE) ×3 IMPLANT
SUT VIC AB 0 SH 27 (SUTURE) ×3 IMPLANT
SUT VIC AB 2-0 SH 27 (SUTURE)
SUT VIC AB 2-0 SH 27XBRD (SUTURE) IMPLANT
SUTURE FIBERWR #2 38 T-5 BLUE (SUTURE) ×1 IMPLANT
SYR BULB 3OZ (MISCELLANEOUS) ×3 IMPLANT
SYR CONTROL 10ML LL (SYRINGE) ×3 IMPLANT
TOWEL OR 17X24 6PK STRL BLUE (TOWEL DISPOSABLE) ×6 IMPLANT
TOWEL OR NON WOVEN STRL DISP B (DISPOSABLE) ×3 IMPLANT
TUBE CONNECTING 20'X1/4 (TUBING) ×1
TUBE CONNECTING 20X1/4 (TUBING) ×2 IMPLANT
UNDERPAD 30X30 INCONTINENT (UNDERPADS AND DIAPERS) ×3 IMPLANT

## 2013-08-27 NOTE — Op Note (Signed)
08/27/2013  4:26 PM  PATIENT:  [name]    PRE-OPERATIVE DIAGNOSIS:  LEFT RUPTURED  ACHILLES TENDON PRIMARY OPEN /PERCUTANEOUS   POST-OPERATIVE DIAGNOSIS:  Same  PROCEDURE:  REPAIR LEFT RUPTURED ACHILLES TENDON PRIMARY OPEN/PERCUTANEOUS   SURGEON:  Sheral ApleyMURPHY, Michiko Lineman, D, MD  ASSISTANT: Janace LittenBrandon Parry OPA  ANESTHESIA:   GENERAL  PREOPERATIVE INDICATIONS:  [name] is a  33 y.o. male with a diagnosis of LEFT RUPTURED  ACHILLES TENDON PRIMARY OPEN /PERCUTANEOUS  who failed conservative measures and elected for surgical management.    The risks benefits and alternatives were discussed with the patient preoperatively including but not limited to the risks of infection, bleeding, nerve injury, cardiopulmonary complications, the need for revision surgery, among others, and the patient was willing to proceed.  OPERATIVE FINDINGS: acute achilles rupture  BLOOD LOSS: min   COMPLICATIONS: none  TOURNIQUET TIME: 45min  OPERATIVE PROCEDURE:  Patient was identified in the preoperative holding area and site was marked by me the patient was transported to the operating theater and placed on the table in supine position taking care to pad all bony prominences. General anesthesia was induced and the patient was turned prone again taking care to pad all bony prominences The left lower extremity was prepped and draped in normal sterile fashion and a pre-incision timeout was performed. [name] received ancef for preoperative antibiotics.   I made a 7 cm incision over the medial side of the Achilles tendon. I made full-thickness skin flaps. I then made a lateral peritenon incision to keep it separate from the skin incision and elevated this off of the tendon.  Next I isolated the Achilles tendon and identified the zone of injury. I debrided the disease tissue back to healthy tendon.   I used an Allis clamp to deliver the tendon into the wound. I then used a #2 FiberWire  stitch and whipstitched it up and back in the proximal tendon. It had an excellent hold on the tendon.  I then repeated this process whipstitch in the distal tendon and again had an excellent hold there was plenty of tendon to get a good bite on it. Excess tension onto the stitches and tied down the other one with excellent apposition of the tendon. I then repeated this process for the other stitches. As very happy with the apposition of the tendon. I dorsiflexed the foot and maintained good apposition throughout to neutral. I then used an 0 Vicryl to stitch around the edges of the tendon in a running fashion.  The wound was then thoroughly irrigated and the peritenon was closed using a #2 Vicryl the wound was then closed the skin using a 2.0 and 4 0 Monocryl. I then placed a sterile dressing and a short leg splint in some plantarflexion.   POST OPERATIVE PLAN: Non-weightbearing. DVT prophylaxis will consist of Ambulation and ASA 81mg  for 30 days

## 2013-08-27 NOTE — H&P (Signed)
  [facility] [address]   [address] [place], [place] [phone] A Division of [facility]  Loreta Aveaniel F. Murphy, M.D.   Robert A. Thurston HoleWainer, M.D.   Burnell BlanksW. Dan Caffrey, M.D.   Eulas PostJoshua P. Landau, M.D.   Lunette StandsAnna Voytek, M.D Jewel Baizeimothy D. Eulah PontMurphy, M.D.  Buford DresserWesley R. Ibazebo, M.D.  Estell HarpinJames S. Kramer, M.D.    Melina Fiddlerebecca S. Bassett, M.D. Mary L. Isidoro DonningAnton, PA-C  Kirstin A. Shepperson, PA-C  Josh Lake Parkhadwell, PA-C KentwoodBrandon Parry, [place] Dakota[place]A-C   RE: Jay SaneMclaughlin, [name]   [id number]      DOB: 09/24/1980 PROGRESS NOTE: 08-25-13 Reason for visit:  [facility] on 08/21/13. History of present illness: He is a Optician, dispensingsemi-professional basketball player and was running, felt a pop in back of his leg and was unable to play after that. [facility] and referred to me for follow-up.   Please see associated documentation for this clinic visit for further past medical, family, surgical and social history, review of systems, and exam findings as this was reviewed by me.  EXAMINATION: Well appearing male in no apparent distress. He has a Soil scientistThompson test to indicate complete disruption of his Achilles. He has a palpable defect just north of mid-substance but is in the tendinous range. He has no active plantarflexion.  IMAGING: None today.  ASSESSMENT: He has an Achilles tendon rupture.  PLAN: 1. 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 2. I had a long discussion with him as to his options. 3. I discussed the fact that newer literature states that re-rupture rate is likely equivalent with and without surgery. I think we can advance him faster with surgery and strength may be better with surgery. 4. He would like to go forward with surgical fixation. Discussed risks benefits and possible complications in detail. 5. We will set this up as soon as possible.  Jewel Baizeimothy D.  Eulah PontMurphy, M.D.  Electronically  verified by Jewel Baizeimothy D. Eulah PontMurphy, M.D. TDM:kah D 08-25-13 T 08-26-13

## 2013-08-27 NOTE — Transfer of Care (Signed)
Immediate Anesthesia Transfer of Care Note  Patient: [name]  Procedure(s) Performed: Procedure(s): REPAIR LEFT RUPTURED ACHILLES TENDON PRIMARY OPEN/PERCUTANEOUS  (Left)  Patient Location: PACU  Anesthesia Type:GA combined with regional for post-op pain  Level of Consciousness: sedated, patient cooperative and confused  Airway & Oxygen Therapy: Patient Spontanous Breathing and Patient connected to face mask oxygen  Post-op Assessment: Report given to PACU RN and Post -op Vital signs reviewed and stable  Post vital signs: Reviewed and stable  Complications: No apparent anesthesia complications

## 2013-08-27 NOTE — Interval H&P Note (Signed)
History and Physical Interval Note:  08/27/2013 8:49 AM  [name]  has presented today for surgery, with the diagnosis of LEFT RUPTURE ACHILLES TENDON PRIMARY OPEN /PERCUTANEOUS   The various methods of treatment have been discussed with the patient and family. After consideration of risks, benefits and other options for treatment, the patient has consented to  Procedure(s): REPAIR LEFT RUPTURE ACHILLES TENDON PRIMARY OPEN/PERCUTANEOUS  (Left) as a surgical intervention .  The patient's history has been reviewed, patient examined, no change in status, stable for surgery.  I have reviewed the patient's chart and labs.  Questions were answered to the patient's satisfaction.     Jarrin Staley, D

## 2013-08-27 NOTE — Progress Notes (Signed)
Assisted Dr. Ossey with left, ultrasound guided, popliteal/saphenous block. Side rails up, monitors on throughout procedure. See vital signs in flow sheet. Tolerated Procedure well. 

## 2013-08-27 NOTE — Anesthesia Procedure Notes (Addendum)
Anesthesia Regional Block:  Popliteal block  Pre-Anesthetic Checklist: ,, timeout performed, Correct Patient, Correct Site, Correct Laterality, Correct Procedure, Correct Position, site marked, Risks and benefits discussed,  Surgical consent,  Pre-op evaluation,  At surgeon's request and post-op pain management  Laterality: Left  Prep: chloraprep       Needles:  Injection technique: Single-shot  Needle Type: Echogenic Stimulator Needle     Needle Length: 10cm 10 cm Needle Gauge: 21 and 21 G    Additional Needles:  Procedures: ultrasound guided (picture in chart) and nerve stimulator Popliteal block  Nerve Stimulator or Paresthesia:  Response: 0.4 mA,   Additional Responses:   Narrative:  Start time: 08/27/2013 2:40 PM End time: 08/27/2013 2:50 PM Injection made incrementally with aspirations every 5 mL.  Performed by: Personally  Anesthesiologist: Arta BruceKevin Ossey MD  Additional Notes: Monitors applied. Patient sedated. Sterile prep and drape,hand hygiene and sterile gloves were used. Relevant anatomy identified.Needle position confirmed.Local anesthetic injected incrementally after negative aspiration. Local anesthetic spread visualized around nerve(s). Vascular puncture avoided. No complications. Image printed for medical record.The patient tolerated the procedure well.  Additional Saphenous nerve block performed. 15cc Local Anesthetic mixture placed under ultrasonic guidance along the medio-inferior border of the Sartorious muscle 6 inches above the knee.  No Problems encountered.  Arta BruceKevin Ossey MD    Procedure Name: Intubation Date/Time: 08/27/2013 3:44 PM Performed by: Gar GibbonKEETON, Chevis Weisensel S Pre-anesthesia Checklist: Patient identified, Emergency Drugs available, Suction available and Patient being monitored Patient Re-evaluated:Patient Re-evaluated prior to inductionOxygen Delivery Method: Circle System Utilized Preoxygenation: Pre-oxygenation with 100% oxygen Intubation Type:  IV induction Ventilation: Mask ventilation without difficulty Laryngoscope Size: Miller and 3 Grade View: Grade II Tube type: Oral Tube size: 8.0 mm Number of attempts: 1 Airway Equipment and Method: stylet and oral airway Placement Confirmation: ETT inserted through vocal cords under direct vision,  positive ETCO2 and breath sounds checked- equal and bilateral Secured at: 22 cm Tube secured with: Tape Dental Injury: Teeth and Oropharynx as per pre-operative assessment

## 2013-08-27 NOTE — Anesthesia Preprocedure Evaluation (Signed)
Anesthesia Evaluation  Patient identified by MRN, date of birth, ID band Patient awake    Reviewed: Allergy & Precautions, H&P , NPO status , Patient's Chart, lab work & pertinent test results  Airway Mallampati: I TM Distance: >3 FB Neck ROM: Full    Dental   Pulmonary Current Smoker,          Cardiovascular     Neuro/Psych    GI/Hepatic   Endo/Other    Renal/GU      Musculoskeletal   Abdominal   Peds  Hematology   Anesthesia Other Findings   Reproductive/Obstetrics                           Anesthesia Physical Anesthesia Plan  ASA: II  Anesthesia Plan: General   Post-op Pain Management:    Induction: Intravenous  Airway Management Planned: Oral ETT  Additional Equipment:   Intra-op Plan:   Post-operative Plan: Extubation in OR  Informed Consent: I have reviewed the patients History and Physical, chart, labs and discussed the procedure including the risks, benefits and alternatives for the proposed anesthesia with the patient or authorized representative who has indicated his/her understanding and acceptance.     Plan Discussed with: CRNA and Surgeon  Anesthesia Plan Comments:         Anesthesia Quick Evaluation  

## 2013-08-27 NOTE — Anesthesia Postprocedure Evaluation (Signed)
Anesthesia Post Note  Patient: [name]  Procedure(s) Performed: Procedure(s) (LRB): REPAIR LEFT RUPTURED ACHILLES TENDON PRIMARY OPEN/PERCUTANEOUS  (Left)  Anesthesia type: general  Patient location: PACU  Post pain: Pain level controlled  Post assessment: Patient's Cardiovascular Status Stable  Last Vitals:  Filed Vitals:   08/27/13 1715  BP: 135/91  Pulse: 85  Temp:   Resp: 18    Post vital signs: Reviewed and stable  Level of consciousness: sedated  Complications: No apparent anesthesia complications

## 2013-08-27 NOTE — Discharge Instructions (Signed)
ELEVATE!!!!!!  No weight on your left foot   Post Anesthesia Home Care Instructions  Activity: Get plenty of rest for the remainder of the day. A responsible adult should stay with you for 24 hours following the procedure.  For the next 24 hours, DO NOT: -Drive a car -Advertising copywriterperate machinery -Drink alcoholic beverages -Take any medication unless instructed by your physician -Make any legal decisions or sign important papers.  Meals: Start with liquid foods such as gelatin or soup. Progress to regular foods as tolerated. Avoid greasy, spicy, heavy foods. If nausea and/or vomiting occur, drink only clear liquids until the nausea and/or vomiting subsides. Call your physician if vomiting continues.  Special Instructions/Symptoms: Your throat may feel dry or sore from the anesthesia or the breathing tube placed in your throat during surgery. If this causes discomfort, gargle with warm salt water. The discomfort should disappear within 24 hours.   Regional Anesthesia Blocks  1. Numbness or the inability to move the "blocked" extremity may last from 3-48 hours after placement. The length of time depends on the medication injected and your individual response to the medication. If the numbness is not going away after 48 hours, call your surgeon.  2. The extremity that is blocked will need to be protected until the numbness is gone and the  Strength has returned. Because you cannot feel it, you will need to take extra care to avoid injury. Because it may be weak, you may have difficulty moving it or using it. You may not know what position it is in without looking at it while the block is in effect.  3. For blocks in the legs and feet, returning to weight bearing and walking needs to be done carefully. You will need to wait until the numbness is entirely gone and the strength has returned. You should be able to move your leg and foot normally before you try and bear weight or walk. You will need  someone to be with you when you first try to ensure you do not fall and possibly risk injury.  4. Bruising and tenderness at the needle site are common side effects and will resolve in a few days.  5. Persistent numbness or new problems with movement should be communicated to the surgeon or the [facility] [phone])/ [facility] [phone]).

## 2013-08-30 ENCOUNTER — Encounter (HOSPITAL_BASED_OUTPATIENT_CLINIC_OR_DEPARTMENT_OTHER): Payer: Self-pay | Admitting: Orthopedic Surgery

## 2013-09-26 ENCOUNTER — Encounter (HOSPITAL_COMMUNITY): Payer: Self-pay | Admitting: Emergency Medicine

## 2013-09-26 ENCOUNTER — Emergency Department (HOSPITAL_COMMUNITY)
Admission: EM | Admit: 2013-09-26 | Discharge: 2013-09-26 | Disposition: A | Discharge status: Home / Self Care | Payer: Self-pay | Attending: Emergency Medicine | Admitting: Emergency Medicine

## 2013-09-26 DIAGNOSIS — Z87828 Personal history of other (healed) physical injury and trauma: Secondary | ICD-10-CM | POA: Insufficient documentation

## 2013-09-26 DIAGNOSIS — K047 Periapical abscess without sinus: Secondary | ICD-10-CM | POA: Insufficient documentation

## 2013-09-26 DIAGNOSIS — K0889 Other specified disorders of teeth and supporting structures: Secondary | ICD-10-CM

## 2013-09-26 DIAGNOSIS — K089 Disorder of teeth and supporting structures, unspecified: Secondary | ICD-10-CM | POA: Insufficient documentation

## 2013-09-26 DIAGNOSIS — K006 Disturbances in tooth eruption: Secondary | ICD-10-CM | POA: Insufficient documentation

## 2013-09-26 DIAGNOSIS — K029 Dental caries, unspecified: Secondary | ICD-10-CM | POA: Insufficient documentation

## 2013-09-26 DIAGNOSIS — K0381 Cracked tooth: Secondary | ICD-10-CM | POA: Insufficient documentation

## 2013-09-26 DIAGNOSIS — F17200 Nicotine dependence, unspecified, uncomplicated: Secondary | ICD-10-CM | POA: Insufficient documentation

## 2013-09-26 MED ORDER — OXYCODONE-ACETAMINOPHEN 5-325 MG PO TABS: 1.0000 | ORAL_TABLET | Freq: Four times a day (QID) | ORAL | Status: DC | PRN

## 2013-09-26 MED ORDER — AMOXICILLIN 500 MG PO CAPS: 500.0000 mg | ORAL_CAPSULE | Freq: Three times a day (TID) | ORAL | Status: DC

## 2013-09-26 NOTE — Discharge Instructions (Signed)
Dental Abscess °A dental abscess is a collection of infected fluid (pus) from a bacterial infection in the inner part of the tooth (pulp). It usually occurs at the end of the tooth's root.  °CAUSES  °· Severe tooth decay. °· Trauma to the tooth that allows bacteria to enter into the pulp, such as a broken or chipped tooth. °SYMPTOMS  °· Severe pain in and around the infected tooth. °· Swelling and redness around the abscessed tooth or in the mouth or face. °· Tenderness. °· Pus drainage. °· Bad breath. °· Bitter taste in the mouth. °· Difficulty swallowing. °· Difficulty opening the mouth. °· Nausea. °· Vomiting. °· Chills. °· Swollen neck glands. °DIAGNOSIS  °· A medical and dental history will be taken. °· An examination will be performed by tapping on the abscessed tooth. °· X-rays may be taken of the tooth to identify the abscess. °TREATMENT °The goal of treatment is to eliminate the infection. You may be prescribed antibiotic medicine to stop the infection from spreading. A root canal may be performed to save the tooth. If the tooth cannot be saved, it may be pulled (extracted) and the abscess may be drained.  °HOME CARE INSTRUCTIONS °· Only take over-the-counter or prescription medicines for pain, fever, or discomfort as directed by your caregiver. °· Rinse your mouth (gargle) often with salt water (¼ tsp salt in 8 oz [250 ml] of warm water) to relieve pain or swelling. °· Do not drive after taking pain medicine (narcotics). °· Do not apply heat to the outside of your face. °· Return to your dentist for further treatment as directed. °SEEK MEDICAL CARE IF: °· Your pain is not helped by medicine. °· Your pain is getting worse instead of better. °SEEK IMMEDIATE MEDICAL CARE IF: °· You have a fever or persistent symptoms for more than 2-3 days. °· You have a fever and your symptoms suddenly get worse. °· You have chills or a very bad headache. °· You have problems breathing or swallowing. °· You have trouble  opening your mouth. °· You have swelling in the neck or around the eye. °Document Released: 01/07/2005 Document Revised: 10/02/2011 Document Reviewed: 04/17/2010 °ExitCare® Patient Information ©2015 [employer]. This information is not intended to replace advice given to you by your health care provider. Make sure you discuss any questions you have with your health care provider. ° ° °Emergency Department Resource Guide °1) Find a Doctor and Pay Out of Pocket °Although you won't have to find out who is covered by your insurance plan, it is a good idea to ask around and get recommendations. You will then need to call the office and see if the doctor you have chosen will accept you as a new patient and what types of options they offer for patients who are self-pay. Some doctors offer discounts or will set up payment plans for their patients who do not have insurance, but you will need to ask so you aren't surprised when you get to your appointment. ° °2) Contact Your Local Health Department °Not all health departments have doctors that can see patients for sick visits, but many do, so it is worth a call to see if yours does. If you don't know where your local health department is, you can check in your phone book. The CDC also has a tool to help you locate your state's health department, and many state websites also have listings of all of their local health departments. ° °3) Find a Walk-in   Clinic °If your illness is not likely to be very severe or complicated, you may want to try a walk in clinic. These are popping up all over the country in pharmacies, drugstores, and shopping centers. They're usually staffed by nurse practitioners or physician assistants that have been trained to treat common illnesses and complaints. They're usually fairly quick and inexpensive. However, if you have serious medical issues or chronic medical problems, these are probably not your best option. ° °No Primary Care Doctor: °- Call  [facility] at  832-8000 - they can help you locate a primary care doctor that  accepts your insurance, provides certain services, etc. °- Physician Referral Service- 1-800-533-3463 ° °Chronic Pain Problems: °Organization         Address  Phone   Notes  °[facility]  (336) 297-2271 Patients need to be referred by their primary care doctor.  ° °Medication Assistance: °Organization         Address  Phone   Notes  °[place] [facility] [address] °[place], [place] [address] (336) 641-8030 --Must be a resident of [place] °-- Must have NO insurance coverage whatsoever (no Medicaid/ Medicare, etc.) °-- The pt. MUST have a primary care doctor that directs their care regularly and follows them in the community °  °[facility]  (866) 331-1348   °[employer]  (888) 892-1162   ° °Agencies that provide inexpensive medical care: °Organization         Address  Phone   Notes  °[facility]  (336) 832-8035   °[facility]    (336) 832-7272   °[facility] [address] °[place], [place] [address] (336) 832-4777   °[facility] [address], °[place] (336) 271-4999   °[facility]    (336) 373-0678   °[facility]    (336) 272-1050   °[facility] ° [address], [place] Phone:  (336) 832-4444, Fax:  (336) 832-4440 Hours of Operation:  9 am - 6 pm, M-F.  Also accepts Medicaid/Medicare and self-pay.  °[facility] ° [address], [place] Phone: (336) 832-3150, Fax: (336) 832-3151. Hours of Operation:  8:30 am - 5:30 pm, M-F.  Also accepts Medicaid and self-pay.  °[facility] [place] [address], [place] Phone: (336) 878-6027   °[facility] [address], [place],  (336)723-1848, Ext. 123 Mondays & Thursdays: 7-9 AM.  First 15 patients are seen on a first come, first serve  basis. °  ° °Medicaid-accepting [place] Providers: ° °Organization         Address  Phone   Notes  °[facility] [address], [place] (336) 641-2100 Also accepts self-pay patients.  °[facility] [address], [place] ° (336) 856-9996   °[facility] [address], [place] (336) 288-8857   °[facility] [address] [place] [address], [place] (336) 299-7000   °Veita Bland [address], [place]  ° (336) 373-1557 Only accepts [place] Access Medicaid patients after they have their name applied to their card.  ° °Self-Pay (no insurance) in [place]: ° °Organization         Address  Phone   Notes  °Sickle Cell Patients, [facility] [address],  (  336) 832-1970   °[facility] [address], [place] (336) 832-4400   °[facility] ° [address], [place] (336) 992-4800   °[facility]/Dr. Osei-Bonsu ° [address] [place] [address], [place] or [address], [place] (336) 841-8500 Phone number for both [place] and [place] locations is the same.  °[facility] [address], [place] (336) 299-0000   °[facility] [place] [address] [place] [address], [place] or [address] (336) 852-7530 °(336) 878-2260   °[facility] [address], [place] (336) 350-1642, phone; (336) 294-5005, fax Sees patients 1st and 3rd Saturday of every month.  Must not qualify for public or private insurance (i.e. Medicaid, Medicare, [place] Health Choice, Veterans' Benefits) • Household income should be no more than 200% of the poverty level •The clinic cannot treat you if you are pregnant or think you are pregnant • Sexually transmitted diseases are not treated at the clinic.  ° ° °Dental Care: °Organization         Address  Phone  Notes  °[place]  [facility] [address], [place] (336) 641-6152 Accepts children up to age 21 who are enrolled in Medicaid or [place] Health Choice; pregnant women with a Medicaid card; and children who have applied for Medicaid or [place] Health Choice, but were declined, whose parents can pay a reduced fee at time of service.  °[facility] [place]  [address], [place] (336) 641-7733 Accepts children up to age 21 who are enrolled in Medicaid or [place] Health Choice; pregnant women with a Medicaid card; and children who have applied for Medicaid or [place] Health Choice, but were declined, whose parents can pay a reduced fee at time of service.  °[facility] ° [address], [place] (336) 641-4533 Patients are seen by appointment only. Walk-ins are not accepted. [facility] will see patients 18 years of age and older. °Monday - Tuesday (8am-5pm) °Most Wednesdays (8:30-5pm) °$30 per visit, cash only  °[facility] ° [address], [place] (336) 641-4533 Patients are seen by appointment only. Walk-ins are not accepted. [facility] will see patients 18 years of age and older. °One Wednesday Evening (Monthly: Volunteer Based).  $30 per visit, cash only  °[facility]  (919) 537-3737 for adults; Children under age 4, call Graduate Pediatric Dentistry at (919) [phone]. Children aged 4-14, please call (919) [phone] to request a pediatric application. ° Dental services are provided in all areas of dental care including fillings, crowns and bridges, complete and partial dentures, implants, gum treatment, root canals, and extractions. Preventive care is also provided. Treatment is provided to both adults and children. °Patients are selected via a lottery and there is often a waiting list. °  °[facility] [address], °[place] ° (336) 763-8833  [email or web address].drcivils.com °  °[facility] [address], [place], [place] (336)723-1848, Ext. 123 Second and Fourth Thursday of each month, opens at 6:30 AM; Clinic ends at 9 AM.  Patients are seen on a first-come first-served basis, and a limited number are seen during each clinic.  ° °[facility] ° [address], [place], [place] (336) 723-7904   Eligibility Requirements °You must have lived in [place], [place], or [place] counties   for at least the last three months. °  You cannot be eligible for state or federal sponsored healthcare insurance, including Veterans Administration, Medicaid, or Medicare. °  You generally cannot be eligible for healthcare insurance through your employer.  °  How to apply: °Eligibility screenings are held every Tuesday and Wednesday afternoon from 1:00 pm until 4:00 pm. You do not need an appointment for the interview!  °[facility] [address], [place], [place] 336-631-2330   °[facility] [facility]  336-342-8273   °[place] [facility]  336-703-3100   ° [facility]  336-570-6415   ° °Behavioral Health Resources in the Community: °Intensive Outpatient Programs °Organization         Address  Phone  Notes  °[facility] [address], [place], [place] 336-878-6098   °[facility] [address], [place], [place] 336-832-9800   °[facility] [address], [place], [address] [place] ° 336-882-2125   °[facility] [address],  °[place], [place] 1-800-853-5163 or 336-641-4981   °Substance Abuse Resources °Organization         Address  Phone  Notes  °[facility]  336-882-2125   °[facility]  336-784-9470   °[facility]  336-285-9073   °[facility]  336-845-3988   °Residential & Outpatient Substance Abuse Program  1-800-659-3381   °Psychological Services °Organization          Address  Phone  Notes  ° Health  336- 832-9600   °[facility]  336- 378-7881   °[facility] [address], [place] 1-800-853-5163 or 336-641-4981   ° °Mobile Crisis Teams °Organization         Address  Phone  Notes  °[facility], Mobile Crisis Care Unit  1-877-626-1772   °Assertive °[facility] ° [address] [place], [place] 336-834-9664   °Sharon DeEsch [address] °[place] 336-554-5454   ° °Self-Help/Support Groups °Organization         Address  Phone             Notes  °[facility] [place] [facility] - variety of support groups  336- 373-1402 Call for more information  °Narcotics Anonymous (NA), [facility] [address], °[place]  2 meetings at this location  ° °Residential Treatment Programs °Organization         Address  Phone  Notes  °[facility] [address],    °[place]  1-866-801-8205   °[facility] ° [address], [place], [place] 704-293-8524   °[facility] [address], [place] 336-845-3988 Admissions: 8am-3pm M-F  °[facility] [address]    °[place], [place] 336-841-1104   °[facility] [address], [place], [place] 336-379-7146   °[facility] [address]  °[place], [place] 336-285-9073   °[facility] Intensive Outpatient [address], [place], [place] 336-852-3033   °[facility] [address]  °[place], [place] 1-877-615-2722 or 336-784-9470   °[facility] [address] [place], [place] 336-227-7417 Accepts Medicaid  °[facility] [address]  °[place] 1-800-659-3381 Substance Abuse/Addiction Treatment  ° °[place] Behavioral Health Resources °Organization         Address  Phone  Notes  °[facility]  (888)   581-9988   °Julie Brannon, PhD [address] [place], [place]   (336) 349-5553 or (336) 951-0000   °[facility]   [address] °[place], [place] (336) 349-4454   °[facility] [address], [place], [place] (336) 342-8316 Insurance/Medicaid/sponsorship through [facility]  °[facility] [address]                                    [place], [place] (336) 342-8316 Therapy/tele-psych/case  °[facility] [address]  ° [place], [place] (336) 349-2233    °Dr. Arfeen  (336) 349-4544   °[facility] [place]  [employer] [place] [facility] 1) [address], [place] °2) [address], [place] °3)  [address], [place] (336) 349-3220 °(336) 342-7768 ° °(336) 342-8140   °[place] Child Abuse [facility] (336) 342-1394 or (336) 342-3537 (After Hours)    ° ° ° °

## 2013-09-26 NOTE — ED Notes (Signed)
Reports right lower side dental pain for days. Airway intact.

## 2013-09-26 NOTE — ED Provider Notes (Signed)
CSN: [id number]     Arrival date & time 09/26/13  1756 History   First MD Initiated Contact with Patient 09/26/13 2000   This chart was scribed for non-physician practitioner Madelyn Flavors, PA-C, working with Vanetta Mulders, MD by Gwenevere Abbot, ED scribe. This patient was seen in room TR11C/TR11C and the patient's care was started at 9:08 PM.    Chief Complaint  Patient presents with  . Dental Pain   The history is provided by the patient. No language interpreter was used.   HPI Comments:  [name] is a 33 y.o. male who presents to the Emergency Department complaining of lower right dental pain that radiates through the jaw and gums, with associated systems of facial swelling in the right jaw region, onset 2 to 3 days ago. Pt reports that the tooth cracked and broke. Pt denies injury. Pt denies that he has not had dental work performed on this tooth.Pt reports that he has taken morphine, without relief. Pt denies fever, chill, nausea, or diarrhea. Pt reports that he recently tore his achilles, and had surgery. Pt reports that he does not have a dentist. Pt has a fish allergy. Pt is a smoker. All other ROS negative.  Past Medical History  Diagnosis Date  . Achilles tendon rupture 08/21/2013    left   Past Surgical History  Procedure Laterality Date  . No past surgeries    . Achilles tendon surgery Left 08/27/2013    Procedure: REPAIR LEFT RUPTURED ACHILLES TENDON PRIMARY OPEN/PERCUTANEOUS ;  Surgeon: Sheral Apley, MD;  Location: [facility];  Service: Orthopedics;  Laterality: Left;   History reviewed. No pertinent family history. History  Substance Use Topics  . Smoking status: Current Every Day Smoker -- 2 years    Types: Cigarettes  . Smokeless tobacco: Never Used     Comment: 2-3 cig./day  . Alcohol Use: Yes     Comment: socially    Review of Systems  HENT: Positive for dental problem.   All other systems reviewed and are  negative.     Allergies  Fish allergy  Home Medications   Prior to Admission medications   Medication Sig Start Date End Date Taking? Authorizing Provider  acetaminophen (TYLENOL) 500 MG tablet Take 500 mg by mouth every 6 (six) hours as needed for mild pain.    Yes Historical Provider, MD  amoxicillin (AMOXIL) 500 MG capsule Take 1 capsule (500 mg total) by mouth 3 (three) times daily. 09/26/13   Yurani Fettes A Forcucci, PA-C  oxyCODONE-acetaminophen (PERCOCET/ROXICET) 5-325 MG per tablet Take 1 tablet by mouth every 6 (six) hours as needed for moderate pain or severe pain. 09/26/13   Martese Vanatta A Forcucci, PA-C   BP 119/65  Pulse 61  Temp(Src) 98.2 F (36.8 C) (Oral)  Resp 17  Ht  (1.803 m)  Wt 160 lb (72.576 kg)  BMI 22.33 kg/m2  SpO2 99% Physical Exam  Nursing note and vitals reviewed. Constitutional: He is oriented to person, place, and time. He appears well-developed and well-nourished.  HENT:  Head: Normocephalic and atraumatic.  Right Ear: External ear normal.  Left Ear: External ear normal.  Mouth/Throat: Uvula is midline, oropharynx is clear and moist and mucous membranes are normal. No trismus in the jaw. Abnormal dentition. Dental abscesses and dental caries present. No uvula swelling.    Eyes: Conjunctivae and EOM are normal. Pupils are equal, round, and reactive to light. No scleral icterus.  Neck: Normal range of motion.  Neck supple. No JVD present. No thyromegaly present.  Cardiovascular: Normal rate, regular rhythm, normal heart sounds and intact distal pulses.  Exam reveals no gallop and no friction rub.   No murmur heard. Pulmonary/Chest: Effort normal and breath sounds normal. No respiratory distress. He has no wheezes. He has no rales. He exhibits no tenderness.  Musculoskeletal: Normal range of motion.  Lymphadenopathy:    He has no cervical adenopathy.  Neurological: He is alert and oriented to person, place, and time.  Skin: Skin is warm and dry.   Psychiatric: He has a normal mood and affect. His behavior is normal. Judgment and thought content normal.    ED Course  INCISION AND DRAINAGE Date/Time: 09/27/2013 3:44 AM Performed by: Terri Piedra A Authorized by: Terri Piedra A Consent: Verbal consent obtained. Risks and benefits: risks, benefits and alternatives were discussed Consent given by: patient Patient understanding: patient states understanding of the procedure being performed Patient consent: the patient's understanding of the procedure matches consent given Procedure consent: procedure consent matches procedure scheduled Relevant documents: relevant documents present and verified Test results: test results available and properly labeled Site marked: the operative site was marked Imaging studies: imaging studies available Patient identity confirmed: verbally with patient Time out: Immediately prior to procedure a "time out" was called to verify the correct patient, procedure, equipment, support staff and site/side marked as required. Type: abscess Body area: mouth (periapical) Local anesthetic: topical benzocaine. Patient sedated: no Needle gauge: 18 Incision type: single straight Complexity: simple Drainage: purulent Drainage amount: moderate Wound treatment: wound left open Patient tolerance: Patient tolerated the procedure well with no immediate complications.    DIAGNOSTIC STUDIES: Oxygen Saturation is 99% on RA, normal by my interpretation.  COORDINATION OF CARE: 9:14 PM-Discussed treatment plan which includes amoxicillin 3 times a day for 7 days, pain mediation and follow up with a dentist with pt at bedside and pt agreed to plan.  Labs Review Labs Reviewed - No data to display  Imaging Review No results found.   EKG Interpretation None      MDM   Final diagnoses:  Dental abscess  Pain, dental  Dental caries  Tobacco dependence   Patient is a 33 y.o. Male who presents with  dental pain.  There is an abscess on exam which was drained as seen above.  No signs of trismus or ludwigs angina.  Patient to be given amoxcillin and hydrocodone.  Patient to follow-up with dentist.  Dr. Georgie Chard information was given.  Patient to return for ludwigs angina symptoms or worsening shortness of breath  I personally performed the services described in this documentation, which was scribed in my presence. The recorded information has been reviewed and is accurate.    Eben Burow, PA-C 09/27/13 [phone]

## 2013-09-26 NOTE — ED Notes (Signed)
NAD at this time.  

## 2013-09-30 NOTE — ED Provider Notes (Signed)
Medical screening examination/treatment/procedure(s) were performed by non-physician practitioner and as supervising physician I was immediately available for consultation/collaboration.   EKG Interpretation None        Zaryiah Barz, MD 09/30/13 0736 

## 2014-06-15 ENCOUNTER — Encounter (HOSPITAL_COMMUNITY): Payer: Self-pay | Admitting: Emergency Medicine

## 2014-06-15 ENCOUNTER — Emergency Department (HOSPITAL_COMMUNITY)
Admission: EM | Admit: 2014-06-15 | Discharge: 2014-06-15 | Disposition: A | Discharge status: Home / Self Care | Payer: Self-pay | Attending: Emergency Medicine | Admitting: Emergency Medicine

## 2014-06-15 DIAGNOSIS — Z87828 Personal history of other (healed) physical injury and trauma: Secondary | ICD-10-CM | POA: Insufficient documentation

## 2014-06-15 DIAGNOSIS — K029 Dental caries, unspecified: Secondary | ICD-10-CM | POA: Insufficient documentation

## 2014-06-15 DIAGNOSIS — Z720 Tobacco use: Secondary | ICD-10-CM | POA: Insufficient documentation

## 2014-06-15 DIAGNOSIS — K0381 Cracked tooth: Secondary | ICD-10-CM | POA: Insufficient documentation

## 2014-06-15 DIAGNOSIS — Z792 Long term (current) use of antibiotics: Secondary | ICD-10-CM | POA: Insufficient documentation

## 2014-06-15 DIAGNOSIS — K047 Periapical abscess without sinus: Secondary | ICD-10-CM | POA: Insufficient documentation

## 2014-06-15 MED ORDER — OXYCODONE-ACETAMINOPHEN 5-325 MG PO TABS: 1.0000 | ORAL_TABLET | Freq: Four times a day (QID) | ORAL | Status: DC | PRN

## 2014-06-15 MED ORDER — AMOXICILLIN 500 MG PO CAPS: 500.0000 mg | ORAL_CAPSULE | Freq: Three times a day (TID) | ORAL | Status: DC

## 2014-06-15 NOTE — ED Notes (Signed)
Pt sts right sided dental pain with possible abscess starting today

## 2014-06-15 NOTE — ED Notes (Signed)
Pt st's he woke up this am with swelling to right lower jaw

## 2014-06-15 NOTE — Discharge Instructions (Signed)
[facility]  [facility]  [address]  [place], [place] [address]  Phone [phone]  [facility] [employer] [facility] [place] exemplifies the [employer] vision to improve the health and quality of life of all [place]ians by Public house manager with a passion to care for the underserved and by leading the nation in community-based, service learning oral health education. We are committed to offering comprehensive general dental services for adults, children and special needs patients in a safe, caring and professional setting.  Appointments: Our clinic is open Monday through Friday 8:00 a.m. until 5:00 p.m. The amount of time scheduled for an appointment depends on the patients specific needs. We ask that you keep your appointed time for care or provide 24-hour notice of all appointment changes. Parents or legal guardians must accompany minor children.  Payment for Services: Medicaid and other insurance plans are welcome. Payment for services is due when services are rendered and may be made by cash or credit card. If you have dental insurance, we will assist you with your claim submission.   Emergencies: Emergency services will be provided Monday through Friday on a walk-in basis. Please arrive early for emergency services. After hours emergency services will be provided for patients of record as required.  Services:  Medical illustrator Dentistry  Oral Surgery - Extractions  Root Canals  Sealants and Tooth Colored Fillings  Crowns and Bridges  Dentures and Veterinary surgeon  3-D/[facility]   You have been diagnosed with Dental pain. Please call the follow up dentist first thing in the  morning on Monday for a follow up appointment. Keep your discharge paperwork from today's visit to bring to the dentist office. You may also use the resource guide listed below to help you find a dentist if you do not already have one to followup with. It is very important that you get evaluated by a dentist as soon as possible.  Use your pain medication as prescribed and do not operate heavy machinery while on pain medication. Note that your pain medication contains acetaminophen (Tylenol) & its is not reccommended that you use additional acetaminophen (Tylenol) while taking this medication. Take your full course of antibiotics. Read the instructions below.  Eat a soft or liquid diet and rinse your mouth out after meals with warm water. You should see a dentist or return here at once if you have increased swelling, increased pain or uncontrolled bleeding from the site of your injury.   SEEK MEDICAL CARE IF:   You have increased pain not controlled with medicines.   You have swelling around your tooth, in your face or neck.   You have bleeding which starts, continues, or gets worse.   You have a fever >101  If you are unable to open your mouth Soft Diet  The soft diet may be recommended after you were put on a full liquid diet. A normal diet may follow. The soft diet can also be used after surgery if you are too ill to keep down a normal diet. The soft diet may also be needed if you have a hard time chewing foods.  DESCRIPTION  Tender foods are used. Foods do not need to be ground or pureed. Most raw fruits and vegetables and coarse breads and  cereals should be avoided. Fried foods and highly seasoned foods may cause discomfort.  NUTRITIONAL ADEQUACY  A healthy diet is possible if foods from each of the basic food groups are eaten daily.  SOFT DIET FOOD LISTS  Milk/Dairy  Allowed: Milk and milk drinks, milk shakes, cream cheese, cottage cheese, mild cheeses.  Avoid: Sharp or highly seasoned  cheese. Meat/Meat Substitutes  Allowed: Broiled, roasted, baked, or stewed tender lean beef, mutton, lamb, veal, chicken, [place], liver, ham, crisp bacon, white fish, tuna, salmon. Eggs, smooth peanut butter.  Avoid: All fried meats, fish, or fowl. Rich gravies and sauces. Lunch meats, sausages, hot dogs. Meats with gristle, chunky peanut butter. Breads/Grains  Allowed: Rice, noodles, spaghetti, macaroni. Dry or cooked refined cereals, such as farina, cream of wheat, oatmeal, grits, whole-wheat cereals. Plain or toasted white or wheat blend or whole-grain breads, soda crackers or saltines, flour tortillas.  Avoid: Wild rice, coarse cereals, such as bran. Seed in or on breads and crackers. Bread or bread products with nuts or seeds. Fruits/Vegetables  Allowed: Fruit and vegetable juices, well-cooked or canned fruits and vegetables, any dried fruit. One citrus fruit daily, 1 vitamin A source daily. Well-ripened, easy to chew fruits, sweet potatoes. Baked, boiled, mashed, creamed, scalloped, or au gratin potatoes. Broths or creamed soups made with allowed vegetables, strained tomatoes.  Avoid: All gas-forming vegetables (corn, radishes, Brussels sprouts, onions, broccoli, cabbage, parsnips, turnips, chili peppers, pinto beans, split peas, dried beans). Fruits containing seeds and skin. Potato chips and corn chips. All others that are not made with allowed vegetables. Highly seasoned soups. Desserts/Sweets  Allowed: Simple desserts, such as custard, junkets, gelatin desserts, plain ice cream and sherbets, simple cakes and cookies, allowed fruits, sugar, syrup, jelly, honey, plain hard candy, and molasses.  Avoid: Rich pastries, any dessert containing dates, nuts, raisins, or coconut. Fried pastries, such as doughnuts. Chocolate. Beverages  Allowed: Fruit and vegetable juices. Caffeine-free carbonated drinks, coffee, and tea.  Avoid: Caffeinated beverages: coffee, tea, soda or pop. Miscellaneous    Allowed: Butter, cream, margarine, mayonnaise, oil. Cream sauces, salt, and mild spices.  Avoid: Highly spiced salad dressings. Highly seasoned foods, hot sauce, mustard, horseradish, and pepper. SAMPLE MENU  Breakfast  Orange juice.  Oatmeal.  Soft cooked egg.  Toast and margarine.  2% milk.  Coffee. Lunch  Meatloaf.  Mashed potato.  Green beans.  Lemon pudding.  Bread and margarine.  Coffee. Dinner  Consomm or apricot nectar.  Chicken breast.  Rice, peas, and carrots.  Applesauce.  Bread and margarine.  2% milk. To cut the amount of fat in your diet, omit margarine and use 1% or skim milk.  NUTRIENT ANALYSIS  Calories........................1953 Kcal.  Protein.........................102 gm.  Carbohydrate...............247 gm.  Fat................................65 gm.  Cholesterol...................449 mg.  Dietary fiber.................19 gm.  Vitamin A.....................2944 RE.  Vitamin C.....................79 mg.  Niacin..........................25 mg.  Riboflavin....................2.0 mg.  Thiamin.......................1.5 mg.  Folate..........................249 mcg.  Calcium.......................1030 mg.  Phosphorus.................1782 mg.  Zinc..............................12 mg.  Iron..............................13 mg.  Sodium.........................299 mg.  Potassium....................3046 mg. Document Released: 04/16/2007 Document Revised: 04/01/2011 Document Reviewed: 04/16/2007  [facility] Patient Information 2014 [place], [place].   RESOURCE GUIDE   Dental Problems  Dr. Grandville Silos $200 dollar visit [address] [place], [place] [address]  [phone]    Patients with Medicaid: [facility] [phone] [address].  [address] Phone:  [phone]                                                  Phone:  [phone]  If unable to pay  or uninsured, contact:  [facility] or [facility] to become qualified for the adult dental clinic.  Chronic Pain Problems Contact [facility]  [phone] Patients need to be referred by their primary care doctor.  Insufficient Money for Medicine Contact United Way:  call "211" or [facility] [phone].  No Primary Care Doctor Call [facility]  [phone] Other agencies that provide inexpensive medical care    [facility]  [phone]    [facility]  [phone]    [facility]  [phone]    [facility]  [phone]    [facility]  [phone]    [facility]  [phone]  Psychological Services [facility]  [phone] [facility]  [phone] [facility]   [phone] (emergency services [phone])  Substance Abuse Resources [facility] and Drug Services  [phone] [facility] [phone] [facility] [phone] [facility] [phone] Residential & Outpatient Substance Abuse Program  [phone]  Abuse/Neglect [facility] Child Abuse Hotline [phone] [facility] Child Abuse Hotline [phone] (After Hours)  Emergency Shelter [facility] [phone]  Maternity Homes [facility] at [facility] [phone] [facility] [phone]  MRSA Hotline #:   678-677-7619347-196-6865    [facility] Resources  [facility]     United Way                          [facility] [address] [place]                       [address]      [address]  [place]                                                [place]                            [place] Phone:  [phone]                                   Phone:  [phone]                 Phone:  [phone]  [facility] Phone:  [phone]  [facility] Child  Abuse Hotline [phone])[phone] (After Hours)

## 2014-06-15 NOTE — ED Provider Notes (Signed)
CSN: [id number]     Arrival date & time 06/15/14  1714 History  This chart was scribed for non-physician practitioner, Jay CaptainAbigail Tye Vigo, PA-C working with Jay BerkshireJoseph Zammit, MD by [name], ED scribe. This patient was seen in room TR06C/TR06C and the patient's care was started at [facility]6:15 PM        Chief Complaint  Patient presents with  . Dental Pain   The history is provided by the patient. No language interpreter was used.    HPI Comments: [name] is a 34 y.o. male who presents to the Emergency Department complaining of gradually worsening right lower dental pain onset this morning after waking up. He reports associated edema of the gums. He also reports that the same tooth was broken one year ago but has not had any complications until the development of today's complaint. Pt states he is a current smoker. He denies difficulty swallowing, SOB, and fever.   Past Medical History  Diagnosis Date  . Achilles tendon rupture 08/21/2013    left   Past Surgical History  Procedure Laterality Date  . No past surgeries    . Achilles tendon surgery Left 08/27/2013    Procedure: REPAIR LEFT RUPTURED ACHILLES TENDON PRIMARY OPEN/PERCUTANEOUS ;  Surgeon: Jay Apleyimothy D Murphy, MD;  Location: [facility];  Service: Orthopedics;  Laterality: Left;   No family history on file. History  Substance Use Topics  . Smoking status: Current Every Day Smoker -- 2 years    Types: Cigarettes  . Smokeless tobacco: Never Used     Comment: 2-3 cig./day  . Alcohol Use: Yes     Comment: socially    Review of Systems  Constitutional: Negative for fever.  HENT: Positive for dental problem. Negative for trouble swallowing.   Respiratory: Negative for shortness of breath.    Allergies  Fish allergy  Home Medications   Prior to Admission medications   Medication Sig Start Date End Date Taking? Authorizing Provider  acetaminophen (TYLENOL) 500 MG tablet Take 500 mg by mouth every 6 (six) hours as  needed for mild pain.     Historical Provider, MD  amoxicillin (AMOXIL) 500 MG capsule Take 1 capsule (500 mg total) by mouth 3 (three) times daily. 09/26/13   Jay Forcucci, PA-C  oxyCODONE-acetaminophen (PERCOCET/ROXICET) 5-325 MG per tablet Take 1 tablet by mouth every 6 (six) hours as needed for moderate pain or severe pain. 09/26/13   Jay Forcucci, PA-C   BP 127/87 mmHg  Pulse 79  Temp(Src) 98.6 F (37 C) (Oral)  Resp 18  SpO2 100% Physical Exam  Constitutional: He is oriented to person, place, and time. He appears well-developed and well-nourished. No distress.  HENT:  Head: Normocephalic and atraumatic.  Fractured tooth with decay of the 1st molar on the right lower side. There is surrounding induration of the gum w/o fluctuance. Uvula midline with no pharyngeal edema. Swallowing easily.   Eyes: Conjunctivae and EOM are normal.  Neck: Neck supple.  Cardiovascular: Normal rate.   Pulmonary/Chest: Breath sounds normal. No respiratory distress.  Neurological: He is alert and oriented to person, place, and time.  Skin: Skin is warm and dry.  Psychiatric: He has a normal mood and affect. His behavior is normal.  Nursing note and vitals reviewed.   ED Course  Procedures (including critical care time) DIAGNOSTIC STUDIES: Oxygen Saturation is 100% on RA, normal by my interpretation.    COORDINATION OF CARE: 6:20 PM Discussed treatment plan with pt at bedside which  includes Amoxicilin and referral to dentist. Pt agreed to plan.   Labs Review Labs Reviewed - No data to display  Imaging Review No results found.   EKG Interpretation None     MDM   Final diagnoses:  Dental infection    Patient with toothache.  No gross abscess.  Exam unconcerning for Ludwig's angina or spread of infection.  Will treat with penicillin and pain medicine.  Urged patient to follow-up with dentist.    I personally performed the services described in this documentation, which was scribed  in my presence. The recorded information has been reviewed and is accurate.      Jay Captain, PA-C 06/15/14 1828  Jay Berkshire, MD 06/16/14 [phone]

## 2017-02-10 ENCOUNTER — Encounter (HOSPITAL_COMMUNITY): Payer: Self-pay | Admitting: Emergency Medicine

## 2017-02-10 DIAGNOSIS — K029 Dental caries, unspecified: Secondary | ICD-10-CM | POA: Insufficient documentation

## 2017-02-10 DIAGNOSIS — K0889 Other specified disorders of teeth and supporting structures: Secondary | ICD-10-CM | POA: Insufficient documentation

## 2017-02-10 DIAGNOSIS — F17210 Nicotine dependence, cigarettes, uncomplicated: Secondary | ICD-10-CM | POA: Insufficient documentation

## 2017-02-10 MED ORDER — OXYCODONE-ACETAMINOPHEN 5-325 MG PO TABS
1.0000 | ORAL_TABLET | Freq: Once | ORAL | Status: AC
  Administered 2017-02-10: 1 via ORAL
  Filled 2017-02-10: qty 1

## 2017-02-10 NOTE — ED Triage Notes (Signed)
Patient reports bilateral lower molar pain onset this week unrelieved by OTC pain medications .

## 2017-02-11 ENCOUNTER — Encounter (HOSPITAL_COMMUNITY): Payer: Self-pay | Admitting: Emergency Medicine

## 2017-02-11 ENCOUNTER — Emergency Department (HOSPITAL_COMMUNITY)
Admission: EM | Admit: 2017-02-11 | Discharge: 2017-02-11 | Disposition: A | Discharge status: Home / Self Care | Payer: Self-pay | Attending: Emergency Medicine | Admitting: Emergency Medicine

## 2017-02-11 ENCOUNTER — Emergency Department (HOSPITAL_COMMUNITY)
Admission: EM | Admit: 2017-02-11 | Discharge: 2017-02-12 | Disposition: A | Discharge status: Home / Self Care | Payer: Self-pay | Attending: Emergency Medicine | Admitting: Emergency Medicine

## 2017-02-11 ENCOUNTER — Other Ambulatory Visit: Payer: Self-pay

## 2017-02-11 DIAGNOSIS — K029 Dental caries, unspecified: Secondary | ICD-10-CM

## 2017-02-11 DIAGNOSIS — R51 Headache: Secondary | ICD-10-CM | POA: Insufficient documentation

## 2017-02-11 DIAGNOSIS — F17210 Nicotine dependence, cigarettes, uncomplicated: Secondary | ICD-10-CM | POA: Insufficient documentation

## 2017-02-11 DIAGNOSIS — K0889 Other specified disorders of teeth and supporting structures: Secondary | ICD-10-CM

## 2017-02-11 MED ORDER — NAPROXEN 250 MG PO TABS
500.0000 mg | ORAL_TABLET | Freq: Once | ORAL | Status: AC
Start: 2017-02-11 — End: 2017-02-11
  Administered 2017-02-11: 500 mg via ORAL
  Filled 2017-02-11: qty 2

## 2017-02-11 MED ORDER — PENICILLIN V POTASSIUM 500 MG PO TABS: 500.0000 mg | ORAL_TABLET | Freq: Four times a day (QID) | ORAL | 0 refills | Status: AC

## 2017-02-11 MED ORDER — NAPROXEN 500 MG PO TABS: 500.0000 mg | ORAL_TABLET | Freq: Two times a day (BID) | ORAL | 0 refills | Status: DC

## 2017-02-11 NOTE — ED Notes (Signed)
Pt c/o toothache on bil side lower back teeth x's 2-3 dyas

## 2017-02-11 NOTE — ED Notes (Signed)
ED Provider at bedside. 

## 2017-02-11 NOTE — ED Notes (Signed)
When attempting to d/c patient - requested work note and then waited, and then asked for pain meds before leaving. Md notified

## 2017-02-11 NOTE — ED Provider Notes (Signed)
[facility] EMERGENCY DEPARTMENT Provider Note   CSN: [id number] Arrival date & time: 02/11/17  2240     History   Chief Complaint Chief Complaint  Patient presents with  . Dental Pain    HPI [name] is a 37 y.o. male.  HPI  37 y/o M presents to the ED c/o dental pain that began a few days ago.  Patient was seen yesterday in the ED and was given Percocet and discharged with antibiotics and naproxen.  States he has had 2 doses of penicillin today and has also been taking naproxen, however he still is having pain and now pain has spread to his upper mouth bilaterally.  States he is having pain to right side of face and jaw bones as well.  Denies any fevers, facial swelling, sore throat, neck swelling, inability to open/close mouth.. States he has tried calling a Education officer, community, however has been unable to make an appointment yet. States he is here today for pain control and is requesting to be sent home with something for pain.  Past Medical History:  Diagnosis Date  . Achilles tendon rupture 08/21/2013   left    There are no active problems to display for this patient.   Past Surgical History:  Procedure Laterality Date  . ACHILLES TENDON SURGERY Left 08/27/2013   Procedure: REPAIR LEFT RUPTURED ACHILLES TENDON PRIMARY OPEN/PERCUTANEOUS ;  Surgeon: Sheral Apley, MD;  Location: [facility];  Service: Orthopedics;  Laterality: Left;  . NO PAST SURGERIES         Home Medications    Prior to Admission medications   Medication Sig Start Date End Date Taking? Authorizing Provider  acetaminophen (TYLENOL) 325 MG tablet Take 2 tablets (650 mg total) by mouth every 6 (six) hours as needed. 02/12/17   Masahiro Iglesia S, PA-C  amoxicillin (AMOXIL) 500 MG capsule Take 1 capsule (500 mg total) by mouth 3 (three) times daily. 06/15/14   Arthor Captain, PA-C  naproxen (NAPROSYN) 375 MG tablet Take 1 tablet (375 mg total) by mouth 2 (two) times daily.  02/12/17   Reynard Christoffersen S, PA-C  oxyCODONE-acetaminophen (PERCOCET/ROXICET) 5-325 MG per tablet Take 1 tablet by mouth every 6 (six) hours as needed for moderate pain or severe pain. 06/15/14   Arthor Captain, PA-C  penicillin v potassium (VEETID) 500 MG tablet Take 1 tablet (500 mg total) by mouth 4 (four) times daily for 7 days. 02/11/17 02/18/17  Shon Baton, MD    Family History No family history on file.  Social History Social History   Tobacco Use  . Smoking status: Current Every Day Smoker    Years: 2.00    Types: Cigarettes  . Smokeless tobacco: Never Used  . Tobacco comment: 2-3 cig./day  Substance Use Topics  . Alcohol use: Yes    Comment: socially  . Drug use: No     Allergies   Fish allergy   Review of Systems Review of Systems  Constitutional: Negative for fever.  HENT: Negative for facial swelling, sore throat and trouble swallowing.        Dental pain, no trismus  Respiratory: Negative for shortness of breath.   Cardiovascular: Negative for chest pain.  Gastrointestinal: Negative for abdominal pain and diarrhea.  Musculoskeletal: Negative for back pain.  Neurological: Positive for headaches.     Physical Exam Updated Vital Signs BP (!) 144/91 (BP Location: Right Arm)   Pulse 77   Temp 98.3 F (36.8 C) (  Oral)   Resp 19   Ht 5\' 11"  (1.803 m)   Wt 72.6 kg (160 lb)   SpO2 99%   BMI 22.32 kg/m   Physical Exam  Constitutional: He is oriented to person, place, and time. He appears well-developed and well-nourished. No distress.  HENT:  Head: Normocephalic and atraumatic.  Multiple dental caries, with multiple erosions noted to molars,  no periapical abscess palpated or noted, no bleeding gums, no TTP of the gums, periodontal disease noted, no trismus, no fullness under the tongue, tenderness to percussion of tooth #30-32, teeth #17-19. No TTP to upper teeth  Eyes: Conjunctivae are normal.  Neck: Normal range of motion. Neck supple.    Cardiovascular: Normal rate and regular rhythm.  Pulmonary/Chest: Effort normal and breath sounds normal.  Abdominal: Soft. There is no tenderness.  Musculoskeletal: Normal range of motion.  Neurological: He is alert and oriented to person, place, and time.  Skin: Skin is warm and dry.     ED Treatments / Results  Labs (all labs ordered are listed, but only abnormal results are displayed) Labs Reviewed - No data to display  EKG  EKG Interpretation None       Radiology No results found.  Procedures Procedures (including critical care time)  Medications Ordered in ED Medications  oxyCODONE-acetaminophen (PERCOCET/ROXICET) 5-325 MG per tablet 1 tablet (1 tablet Oral Given 02/12/17 0020)     Initial Impression / Assessment and Plan / ED Course  I have reviewed the triage vital signs and the nursing notes.  Pertinent labs & imaging results that were available during my care of the patient were reviewed by me and considered in my medical decision making (see chart for details).     Final Clinical Impressions(s) / ED Diagnoses   Final diagnoses:  Pain due to dental caries    Patient with toothache.  No gross abscess.  Exam unconcerning for Ludwig's angina or spread of infection. Seen yesterday for same and given Rx for penicillin and pain medicine.  Urged patient to continue taking PCN, pain medications, and follow-up with dentist.  VSS, with mild HTN that is chronic for pt. afebrile. Return precautions given.   ED Discharge Orders        Ordered    acetaminophen (TYLENOL) 325 MG tablet  Every 6 hours PRN     02/12/17 0029    naproxen (NAPROSYN) 375 MG tablet  2 times daily     02/12/17 0029       Karrie MeresCouture, Tabbetha Kutscher S, PA-C 02/12/17 0035    Doug SouJacubowitz, Sam, MD 02/13/17 [phone]

## 2017-02-11 NOTE — Discharge Instructions (Signed)
You were seen today for dental pain.  You have multiple caries and may have an underlying infection.  You need to follow-up with dentist.  Take antibiotics as instructed.

## 2017-02-11 NOTE — ED Provider Notes (Signed)
[facility]   CSN: [id number] Arrival date & time: 02/10/17  2219     History   Chief Complaint Chief Complaint  Patient presents with  . Dental Pain    HPI [name] is a 37 y.o. male.  HPI  This is a 37 year old male who presents with tooth pain.  Patient reports bilateral jaw and tooth pain.  He states "I think it is my wisdom teeth."  Patient states that the pain worsened today but started yesterday.  Denies any difficulty swallowing.  He does not have a dentist.  Currently rates pain at 6 out of 10.  States his pain was 10 out of 10 prior to receiving Percocet in triage.  He took ibuprofen and Tylenol at home with minimal relief.  Denies any fevers.  Past Medical History:  Diagnosis Date  . Achilles tendon rupture 08/21/2013   left    There are no active problems to display for this patient.   Past Surgical History:  Procedure Laterality Date  . ACHILLES TENDON SURGERY Left 08/27/2013   Procedure: REPAIR LEFT RUPTURED ACHILLES TENDON PRIMARY OPEN/PERCUTANEOUS ;  Surgeon: Sheral Apleyimothy D Murphy, MD;  Location: [facility];  Service: Orthopedics;  Laterality: Left;  . NO PAST SURGERIES         Home Medications    Prior to Admission medications   Medication Sig Start Date End Date Taking? Authorizing Provider  acetaminophen (TYLENOL) 500 MG tablet Take 500 mg by mouth every 6 (six) hours as needed for mild pain.     [provider]  amoxicillin (AMOXIL) 500 MG capsule Take 1 capsule (500 mg total) by mouth 3 (three) times daily. 06/15/14   Harris, Cammy CopaAbigail, PA-C  naproxen (NAPROSYN) 500 MG tablet Take 1 tablet (500 mg total) by mouth 2 (two) times daily. 02/11/17   Kayte Borchard, Mayer Maskerourtney F, MD  oxyCODONE-acetaminophen (PERCOCET/ROXICET) 5-325 MG per tablet Take 1 tablet by mouth every 6 (six) hours as needed for moderate pain or severe pain. 06/15/14   Arthor CaptainHarris, Abigail, PA-C  penicillin v potassium  (VEETID) 500 MG tablet Take 1 tablet (500 mg total) by mouth 4 (four) times daily for 7 days. 02/11/17 02/18/17  Shon BatonHorton, Mortimer Bair F, MD    Family History No family history on file.  Social History Social History   Tobacco Use  . Smoking status: Current Every Day Smoker    Years: 2.00    Types: Cigarettes  . Smokeless tobacco: Never Used  . Tobacco comment: 2-3 cig./day  Substance Use Topics  . Alcohol use: Yes    Comment: socially  . Drug use: No     Allergies   Fish allergy   Review of Systems Review of Systems  Constitutional: Negative for fever.  HENT: Positive for dental problem. Negative for trouble swallowing.   All other systems reviewed and are negative.    Physical Exam Updated Vital Signs BP 130/80 (BP Location: Right Arm)   Pulse 60   Temp 99.1 F (37.3 C) (Oral)   Resp 20   SpO2 97%   Physical Exam  Constitutional: He is oriented to person, place, and time. He appears well-developed and well-nourished. No distress.  HENT:  Head: Normocephalic and atraumatic.  Multiple dental caries, erosions noted significantly to tooth #30 and tooth #19, no periapical abscess palpated or noted, periodontal disease noted, no trismus, no fullness under the tongue  Eyes: Pupils are equal, round, and reactive to light.  Neck: Neck  supple.  Cardiovascular: Normal rate, regular rhythm and normal heart sounds.  No murmur heard. Pulmonary/Chest: Effort normal and breath sounds normal. No respiratory distress. He has no wheezes.  Abdominal: Soft. Bowel sounds are normal. There is no tenderness. There is no rebound.  Musculoskeletal: He exhibits no edema.  Lymphadenopathy:    He has no cervical adenopathy.  Neurological: He is alert and oriented to person, place, and time.  Skin: Skin is warm and dry.  Psychiatric: He has a normal mood and affect.  Nursing note and vitals reviewed.    ED Treatments / Results  Labs (all labs ordered are listed, but only abnormal  results are displayed) Labs Reviewed - No data to display  EKG  EKG Interpretation None       Radiology No results found.  Procedures Procedures (including critical care time)  Medications Ordered in ED Medications  oxyCODONE-acetaminophen (PERCOCET/ROXICET) 5-325 MG per tablet 1 tablet (1 tablet Oral Given 02/10/17 2237)     Initial Impression / Assessment and Plan / ED Course  I have reviewed the triage vital signs and the nursing notes.  Pertinent labs & imaging results that were available during my care of the patient were reviewed by me and considered in my medical decision making (see chart for details).     Patient presents with dental pain.  Nontoxic appearing.  Vital signs stable.  Improved with Percocet.  No signs or symptoms of periapical abscess.  No symptoms or signs of deep space infection.  Discussed with patient that he will need to follow-up closely with dentistry.  Will discharge with naproxen and penicillin for occult infection.  After history, exam, and medical workup I feel the patient has been appropriately medically screened and is safe for discharge home. Pertinent diagnoses were discussed with the patient. Patient was given return precautions.   Final Clinical Impressions(s) / ED Diagnoses   Final diagnoses:  Dental caries  Pain, dental    ED Discharge Orders        Ordered    naproxen (NAPROSYN) 500 MG tablet  2 times daily     02/11/17 0137    penicillin v potassium (VEETID) 500 MG tablet  4 times daily     02/11/17 0137       Shon Baton, MD 02/11/17 [id number]

## 2017-02-11 NOTE — ED Triage Notes (Signed)
Pt states he was seen yesterday for bilateral 10/10 dental pain not getting better with medication prescribed, pt is not able to eat or sleep.

## 2017-02-11 NOTE — ED Notes (Signed)
Pt verbalizes understanding of d/c instructions. Pt received prescriptions. Pt ambulatory at d/c with all belongings.  

## 2017-02-12 MED ORDER — OXYCODONE-ACETAMINOPHEN 5-325 MG PO TABS
1.0000 | ORAL_TABLET | Freq: Once | ORAL | Status: AC
  Administered 2017-02-12: 1 via ORAL
  Filled 2017-02-12: qty 1

## 2017-02-12 MED ORDER — NAPROXEN 375 MG PO TABS: 375.0000 mg | ORAL_TABLET | Freq: Two times a day (BID) | ORAL | 0 refills | Status: DC

## 2017-02-12 MED ORDER — ACETAMINOPHEN 325 MG PO TABS: 650.0000 mg | ORAL_TABLET | Freq: Four times a day (QID) | ORAL | 0 refills | Status: DC | PRN

## 2017-02-12 NOTE — Discharge Instructions (Signed)
Please follow up with a dentist as soon as possible.  Continue taking the antibiotic that you have been prescribed until it is finished. Rotate Tylenol and naproxen for pain as we discussed during your visit.  Do not take other NSAIDs such as ibuprofen while you are taking naproxen.  Please return to the ER sooner if you have any new or worsening symptoms, including any worsening signs of infection such as fever, worsening facial swelling, or inability to open your mouth

## 2017-02-13 ENCOUNTER — Encounter (HOSPITAL_COMMUNITY): Payer: Self-pay | Admitting: Emergency Medicine

## 2017-02-13 ENCOUNTER — Other Ambulatory Visit: Payer: Self-pay

## 2017-02-13 ENCOUNTER — Emergency Department (HOSPITAL_COMMUNITY)
Admission: EM | Admit: 2017-02-13 | Discharge: 2017-02-13 | Disposition: A | Discharge status: Home / Self Care | Payer: Self-pay | Attending: Emergency Medicine | Admitting: Emergency Medicine

## 2017-02-13 DIAGNOSIS — K029 Dental caries, unspecified: Secondary | ICD-10-CM | POA: Insufficient documentation

## 2017-02-13 DIAGNOSIS — F17210 Nicotine dependence, cigarettes, uncomplicated: Secondary | ICD-10-CM | POA: Insufficient documentation

## 2017-02-13 MED ORDER — OXYCODONE-ACETAMINOPHEN 5-325 MG PO TABS
1.0000 | ORAL_TABLET | Freq: Once | ORAL | Status: AC
  Administered 2017-02-13: 1 via ORAL
  Filled 2017-02-13: qty 1

## 2017-02-13 NOTE — ED Triage Notes (Signed)
Pt has been seen for bilateral dental pain three times.  He reports that the pain has not decreased despite taking the medications prescribed.  Pt reports having a dental appointment tomorrow.

## 2017-02-13 NOTE — Discharge Instructions (Signed)
Continue with the antibiotic and the naproxen. Add [phone] mg of tylenol 6 hours after taking the naproxen. Follow-up with the dentist as discussed.

## 2017-02-13 NOTE — ED Provider Notes (Signed)
[facility]   CSN: [id number] Arrival date & time: 02/13/17  2133     History   Chief Complaint Chief Complaint  Patient presents with  . Dental Pain    HPI [name] is a 37 y.o. male.  Patient has been seen several times (01/22, 01/23) for dental pain. He has received Rx for naprosyn and Pen V-K. He states he has taken both but is continuing to have pain.   The history is provided by the patient and medical records. No language interpreter was used.  Dental Pain   This is a recurrent problem. The current episode started more than 1 week ago. The problem has been gradually worsening. The pain is moderate. Treatments tried: antibiotics, NSAID. The treatment provided no relief.    Past Medical History:  Diagnosis Date  . Achilles tendon rupture 08/21/2013   left    There are no active problems to display for this patient.   Past Surgical History:  Procedure Laterality Date  . ACHILLES TENDON SURGERY Left 08/27/2013   Procedure: REPAIR LEFT RUPTURED ACHILLES TENDON PRIMARY OPEN/PERCUTANEOUS ;  Surgeon: Sheral Apleyimothy D Murphy, MD;  Location: [facility];  Service: Orthopedics;  Laterality: Left;  . NO PAST SURGERIES         Home Medications    Prior to Admission medications   Medication Sig Start Date End Date Taking? Authorizing Provider  acetaminophen (TYLENOL) 325 MG tablet Take 2 tablets (650 mg total) by mouth every 6 (six) hours as needed. 02/12/17   Couture, Cortni S, PA-C  amoxicillin (AMOXIL) 500 MG capsule Take 1 capsule (500 mg total) by mouth 3 (three) times daily. 06/15/14   Arthor CaptainHarris, Abigail, PA-C  naproxen (NAPROSYN) 375 MG tablet Take 1 tablet (375 mg total) by mouth 2 (two) times daily. 02/12/17   Couture, Cortni S, PA-C  oxyCODONE-acetaminophen (PERCOCET/ROXICET) 5-325 MG per tablet Take 1 tablet by mouth every 6 (six) hours as needed for moderate pain or severe pain. 06/15/14   Arthor CaptainHarris,  Abigail, PA-C  penicillin v potassium (VEETID) 500 MG tablet Take 1 tablet (500 mg total) by mouth 4 (four) times daily for 7 days. 02/11/17 02/18/17  Shon BatonHorton, Courtney F, MD    Family History No family history on file.  Social History Social History   Tobacco Use  . Smoking status: Current Every Day Smoker    Years: 2.00    Types: Cigarettes  . Smokeless tobacco: Never Used  . Tobacco comment: 2-3 cig./day  Substance Use Topics  . Alcohol use: Yes    Comment: socially  . Drug use: No     Allergies   Fish allergy   Review of Systems Review of Systems  HENT: Positive for dental problem. Negative for facial swelling.   All other systems reviewed and are negative.    Physical Exam Updated Vital Signs BP (!) 155/95 (BP Location: Right Arm)   Pulse (!) 59   Temp 98.2 F (36.8 C) (Oral)   Resp 16   Ht 5\' 11"  (1.803 m)   Wt 74.8 kg (165 lb)   SpO2 100%   BMI 23.01 kg/m   Physical Exam  Constitutional: He is oriented to person, place, and time. He appears well-developed and well-nourished.  HENT:  Head: Normocephalic.  Mouth/Throat: Oropharynx is clear and moist and mucous membranes are normal. No trismus in the jaw. Dental caries present.    Eyes: Conjunctivae are normal.  Neck: Neck supple.  Cardiovascular:  Normal rate and regular rhythm.  Pulmonary/Chest: Effort normal and breath sounds normal.  Abdominal: Soft. Bowel sounds are normal.  Musculoskeletal: Normal range of motion. He exhibits no edema.  Lymphadenopathy:    He has no cervical adenopathy.  Neurological: He is alert and oriented to person, place, and time.  Skin: Skin is warm and dry.  Psychiatric: He has a normal mood and affect.  Nursing note and vitals reviewed.    ED Treatments / Results  Labs (all labs ordered are listed, but only abnormal results are displayed) Labs Reviewed - No data to display  EKG  EKG Interpretation None       Radiology No results  found.  Procedures Procedures (including critical care time)  Medications Ordered in ED Medications  oxyCODONE-acetaminophen (PERCOCET/ROXICET) 5-325 MG per tablet 1 tablet (1 tablet Oral Given 02/13/17 2334)     Initial Impression / Assessment and Plan / ED Course  I have reviewed the triage vital signs and the nursing notes.  Pertinent labs & imaging results that were available during my care of the patient were reviewed by me and considered in my medical decision making (see chart for details).     Patient with dentalgia.  No abscess requiring immediate incision and drainage.  Exam not concerning for Ludwig's angina or pharyngeal abscess.  Patient to continue treatment with antibiotic and NSAID. Add tylenol [phone] mg in between (6 hours after) naprosyn dosing Pt instructed to follow-up with dentist. He states he has an appointment tomorrow. Patient also given referral to our dental coverage provider.  Discussed return precautions. Pt safe for discharge.  Final Clinical Impressions(s) / ED Diagnoses   Final diagnoses:  Pain due to dental caries    ED Discharge Orders    None       Felicie Morn, NP 02/14/17 Kandyce Rud, April, MD 02/14/17 1610

## 2019-06-07 ENCOUNTER — Inpatient Hospital Stay: Admit: 2019-06-07 | Discharge: 2019-06-07 | Payer: BLUE CROSS/BLUE SHIELD | Primary: Internal Medicine

## 2019-06-07 ENCOUNTER — Ambulatory Visit: Admit: 2019-06-07 | Payer: BLUE CROSS/BLUE SHIELD | Attending: Nephrology | Primary: Internal Medicine

## 2019-06-07 ENCOUNTER — Encounter: Admit: 2019-06-07 | Payer: PRIVATE HEALTH INSURANCE | Attending: Nephrology | Primary: Internal Medicine

## 2019-06-07 DIAGNOSIS — D6861 Antiphospholipid syndrome: Secondary | ICD-10-CM

## 2019-06-07 DIAGNOSIS — I10 Essential (primary) hypertension: Secondary | ICD-10-CM

## 2019-06-07 DIAGNOSIS — F1020 Alcohol dependence, uncomplicated: Secondary | ICD-10-CM

## 2019-06-07 DIAGNOSIS — I2699 Other pulmonary embolism without acute cor pulmonale: Secondary | ICD-10-CM

## 2019-06-07 DIAGNOSIS — N1831 Stage 3a chronic kidney disease (HC CODE) (HC Code): Secondary | ICD-10-CM

## 2019-06-07 DIAGNOSIS — M109 Gout, unspecified: Secondary | ICD-10-CM

## 2019-06-07 LAB — COMPREHENSIVE METABOLIC PANEL
BKR A/G RATIO: 2.2 (ref 1.0–2.2)
BKR ALANINE AMINOTRANSFERASE (ALT): 14 U/L (ref 9–59)
BKR ALBUMIN: 4.7 g/dL (ref 3.6–4.9)
BKR ALKALINE PHOSPHATASE: 52 U/L (ref 9–122)
BKR ANION GAP: 9 (ref 7–17)
BKR ASPARTATE AMINOTRANSFERASE (AST): 17 U/L (ref 10–35)
BKR AST/ALT RATIO: 1.2
BKR BILIRUBIN TOTAL: 0.5 mg/dL (ref <=1.2)
BKR BLOOD UREA NITROGEN: 14 mg/dL (ref 6–20)
BKR BUN / CREAT RATIO: 11.1 (ref 8.0–23.0)
BKR CALCIUM: 9 mg/dL (ref 8.8–10.2)
BKR CHLORIDE: 98 mmol/L (ref 98–107)
BKR CO2: 27 mmol/L (ref 20–30)
BKR CREATININE: 1.26 mg/dL (ref 0.40–1.30)
BKR EGFR (AFR AMER): >60 mL/min/{1.73_m2} (ref >60)
BKR EGFR (NON AFRICAN AMERICAN): >60 mL/min/{1.73_m2} (ref >60)
BKR GLOBULIN: 2.1 g/dL — ABNORMAL LOW (ref 2.3–3.5)
BKR GLUCOSE: 83 mg/dL (ref 70–100)
BKR POTASSIUM: 4.4 mmol/L (ref 3.3–5.1)
BKR PROTEIN TOTAL: 6.8 g/dL (ref 6.6–8.7)
BKR SODIUM: 134 mmol/L — ABNORMAL LOW (ref 136–144)

## 2019-06-07 LAB — PTH, INTACT WITHOUT CALCIUM: BKR PARATHYROID HORMONE INTACT: 62.3 pg/mL (ref 15.0–65.0)

## 2019-06-07 LAB — CBC WITH AUTO DIFFERENTIAL
BKR WAM ABSOLUTE IMMATURE GRANULOCYTES: 0 x 1000/ÂµL (ref 0.0–0.3)
BKR WAM ABSOLUTE LYMPHOCYTE COUNT: 1.5 x 1000/ÂµL (ref 1.0–4.0)
BKR WAM ABSOLUTE NRBC: 0 x 1000/ÂµL (ref 0.0–0.0)
BKR WAM ANALYZER ANC: 3 x 1000/ÂµL (ref 1.0–11.0)
BKR WAM BASOPHIL ABSOLUTE COUNT: 0 x 1000/ÂµL (ref 0.0–0.0)
BKR WAM BASOPHILS: 0.8 % (ref 0.0–4.0)
BKR WAM EOSINOPHIL ABSOLUTE COUNT: 0.2 x 1000/ÂµL (ref 0.0–1.0)
BKR WAM EOSINOPHILS: 4.1 % (ref 0.0–7.0)
BKR WAM HEMATOCRIT: 37.1 % (ref 37.0–52.0)
BKR WAM HEMOGLOBIN: 12.5 g/dL (ref 12.0–18.0)
BKR WAM IMMATURE GRANULOCYTES: 0.2 % (ref 0.0–3.0)
BKR WAM LYMPHOCYTES: 30.2 % (ref 8.0–49.0)
BKR WAM MCH (PG): 32.1 pg — ABNORMAL HIGH (ref 27.0–31.0)
BKR WAM MCHC: 33.7 g/dL (ref 31.0–36.0)
BKR WAM MCV: 95.4 fL — ABNORMAL HIGH (ref 78.0–94.0)
BKR WAM MONOCYTE ABSOLUTE COUNT: 0.3 x 1000/ÂµL (ref 0.0–2.0)
BKR WAM MONOCYTES: 6.5 % (ref 4.0–15.0)
BKR WAM MPV: 10.6 fL (ref 6.0–11.0)
BKR WAM NEUTROPHILS: 58.2 % (ref 37.0–84.0)
BKR WAM NUCLEATED RED BLOOD CELLS: 0 % (ref 0.0–1.0)
BKR WAM PLATELETS: 244 x1000/ÂµL (ref 140–440)
BKR WAM RDW-CV: 11.7 % (ref 11.5–14.5)
BKR WAM RED BLOOD CELL COUNT: 3.9 M/ÂµL (ref 3.8–5.9)
BKR WAM WHITE BLOOD CELL COUNT: 5.1 x1000/ÂµL (ref 4.0–10.0)

## 2019-06-07 LAB — URINALYSIS-MACROSCOPIC W/REFLEX MICROSCOPIC
BKR BILIRUBIN, UA: NEGATIVE
BKR BLOOD, UA: NEGATIVE
BKR GLUCOSE, UA: NEGATIVE
BKR KETONES, UA: NEGATIVE
BKR LEUKOCYTE ESTERASE, UA: NEGATIVE
BKR NITRITE, UA: NEGATIVE
BKR PH, UA: 6 (ref 5.5–7.5)
BKR PROTEIN, UA: NEGATIVE
BKR SPECIFIC GRAVITY, UA: 1.012 (ref 1.005–1.030)
BKR UROBILINOGEN, UA: <2 EU/dL (ref <=2.0)

## 2019-06-07 LAB — ALBUMIN/CREATININE PANEL, URINE, RANDOM
BKR ALBUMIN, URINE, RANDOM: <12 mg/L
BKR CREATININE, URINE, RANDOM: 64 mg/dL

## 2019-06-07 LAB — PHOSPHORUS     (BH GH L LMW YH): BKR PHOSPHORUS: 3.7 mg/dL (ref 2.2–4.5)

## 2019-06-07 MED ORDER — XARELTO 20 MG TABLET
20 mg | Status: AC
Start: 2019-06-07 — End: 2019-12-17

## 2019-06-07 NOTE — Progress Notes
Referral from Dr [name] for CKD and HTN.Dr. Grieder is a 39 year old man referred to me for the evaluation of CKD in the setting of now well-controlled hypertension.Hypertension diagnosed at 14. Started treatment when 17. Secondary  Workup then was neg (includign renal angio) and has been treated since. Not sure of the details or others. Treated with a single drug (amlodipine) for many years until recent increase. Last year, Maxzide and lisinopril were added, and along with sig dietary changesand wt loss (40 lb), BP came to adequate control. Progressive loss of GFR over the last 4-5 years. No FHx of CKD. No NSAID use, PPIs. No supplements. ROS is entirely negative other than for some fatigue that he relates to his job.  Denies visual, ear, sinus, throat complaints.  No cardiorespiratory symptoms.  No GI symptoms.  No urinary symptoms.  No rashes.  No edema formation.  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  He has several family members with early hypertension and heart disease or cancer.Social history:  Lives in [place] with his husband.  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  He does not have children.  Formerly with significant alcohol use.  No tobacco or illicit drugs.Exam:BP 114/73 (Site: l a, Position: Sitting, Cuff Size: Medium)  - Pulse 65  - Temp 97.7 ?F (36.5 ?C) (Temporal)  - Ht 6' (1.829 m)  - Wt 80.2 kg  - SpO2 99%  - BMI 23.98 kg/m? General: NAD.HEENT: Moist mucous membranes. Oropharynx clear.Neck: No JVD. No cervical adenopathy. No carotid bruits.Chest: CTA.Cor: regular, no MRG.Abdomen: benign. No organomegalies. No CVAT.Extremities: No edema. Normal pulsesLab Results Component Value Date  WBC 5.5 11/23/2018  HGB 16.0 11/23/2018 HCT 45.5 11/23/2018  PLT 246 11/23/2018  GLU 98 01/12/2019  BUN 14 01/12/2019  CREATININE 1.40 (H) 01/12/2019  CO2 30 01/12/2019  CL 94 (L) 01/12/2019  NA 133 (L) 01/12/2019  K 4.0 01/12/2019  CALCIUM 9.3 01/12/2019  PHOS 3.1 02/23/2010  HGBA1C 5.1 11/23/2018  IRON 65 07/02/2011  TIBC 290 07/02/2011  FERRITIN 96 07/02/2011 No recent urinalysis. Formerly negative (last in 2015)No recent renal imaging. Normal [place] in 2012.ANA negImpression and recommendations:1. CKD stage IIIA.He has had progressive loss of renal function.  He has no systemic symptoms to suggest a systemic disease.  His APLS is well controlled  and it is primary.  No exposure to nephrotoxins currently or in the past.  He had a significant decrease in BP over the past several months, though upward trend in serum creatinine preceded his achievement of BP control (along with the addition of an ACE-inhibitor and a diuretic).  Long-term exposure to DOACs is associated with a modest increase in risk of CKD, though cause a relationships have not been established.  Lastly, he had early gout (bow was obese and consumed excessive amount of alcohol).  He brings up the possibility of inherited tubular interstitial disease, though he does not have a strong family history, therefore I chose to not pursue genetic testing.He needs a more detailed evaluation that will include a urinalysis with microscopic sediment, albumin to creatinine excretion, as well as repeat comprehensive metabolic panel and CBC.  He will also need repeat imaging to evaluate his anatomy.If all negative, I will likely ascribe it to hypertension or possibly to exposure to Xarelto.  If his renal function continues to worsen, I told  him that we could entertain switching him from Xarelto to Eliquis.From a management perspective, his BP is very well controlled.  I will make adjustments in case there are any complications of CKD.  I ordered a mineral metabolism profile as part of that evaluation.If potassium allows, we could consider increasing the dose of lisinopril while decreasing the dose of amlodipine to maximize is benefits from RAS blockade.I will make further recommendations after reviewing the results of the above tests.  Otherwise, I plan to see him yearly.Janey Greaser, MDProfessor of Medicine (Nephrology)Vice Chair for Quality & Safety, Dept of MedicineClinical Chief, Section of Nephrology

## 2019-06-08 LAB — VITAMIN D 25 (VITAMIN D STATUS)(MINMETABLAB)(YH): VITAMIN D 25-HYDROXY: 10 ng/mL — ABNORMAL LOW (ref 20–50)

## 2019-06-10 ENCOUNTER — Encounter: Admit: 2019-06-10 | Payer: PRIVATE HEALTH INSURANCE | Attending: Nephrology | Primary: Internal Medicine

## 2019-06-28 ENCOUNTER — Inpatient Hospital Stay: Admit: 2019-06-28 | Discharge: 2019-06-28 | Payer: BLUE CROSS/BLUE SHIELD | Primary: Internal Medicine

## 2019-06-28 DIAGNOSIS — I10 Essential (primary) hypertension: Secondary | ICD-10-CM

## 2019-06-28 DIAGNOSIS — N1831 Chronic kidney disease, stage 3a: Secondary | ICD-10-CM

## 2019-07-03 ENCOUNTER — Encounter: Admit: 2019-07-03 | Payer: PRIVATE HEALTH INSURANCE | Attending: Family | Primary: Internal Medicine

## 2019-07-03 DIAGNOSIS — I10 Essential (primary) hypertension: Secondary | ICD-10-CM

## 2019-07-05 ENCOUNTER — Encounter: Admit: 2019-07-05 | Payer: PRIVATE HEALTH INSURANCE | Attending: Internal Medicine | Primary: Internal Medicine

## 2019-07-05 DIAGNOSIS — I10 Essential (primary) hypertension: Secondary | ICD-10-CM

## 2019-07-08 ENCOUNTER — Emergency Department (HOSPITAL_COMMUNITY)
Admission: EM | Admit: 2019-07-08 | Discharge: 2019-07-08 | Disposition: A | Discharge status: Home / Self Care | Payer: Self-pay | Attending: Emergency Medicine | Admitting: Emergency Medicine

## 2019-07-08 ENCOUNTER — Other Ambulatory Visit: Payer: Self-pay

## 2019-07-08 ENCOUNTER — Encounter: Admit: 2019-07-08 | Payer: PRIVATE HEALTH INSURANCE | Attending: Internal Medicine | Primary: Internal Medicine

## 2019-07-08 DIAGNOSIS — K029 Dental caries, unspecified: Secondary | ICD-10-CM | POA: Insufficient documentation

## 2019-07-08 DIAGNOSIS — F17210 Nicotine dependence, cigarettes, uncomplicated: Secondary | ICD-10-CM | POA: Insufficient documentation

## 2019-07-08 DIAGNOSIS — K0889 Other specified disorders of teeth and supporting structures: Secondary | ICD-10-CM | POA: Insufficient documentation

## 2019-07-08 DIAGNOSIS — I10 Essential (primary) hypertension: Secondary | ICD-10-CM

## 2019-07-08 MED ORDER — LISINOPRIL 10 MG TABLET
10 mg | ORAL_TABLET | Freq: Every day | ORAL | 4 refills | Status: AC
Start: 2019-07-08 — End: 2020-11-30

## 2019-07-08 MED ORDER — IBUPROFEN 600 MG PO TABS: 600.0000 mg | ORAL_TABLET | Freq: Four times a day (QID) | ORAL | 0 refills | Status: DC | PRN

## 2019-07-08 MED ORDER — PENICILLIN V POTASSIUM 500 MG PO TABS: 500.0000 mg | ORAL_TABLET | Freq: Three times a day (TID) | ORAL | 0 refills | Status: DC

## 2019-07-08 NOTE — Telephone Encounter
erxd

## 2019-07-08 NOTE — ED Triage Notes (Signed)
Pt here for eval of L upper and lower dental pain x 6-7 months. Sts it caused him to miss work today and was unable to sleep last night. Taking tylenol and ibuprofen with minimal relief.

## 2019-07-08 NOTE — ED Provider Notes (Signed)
[facility] EMERGENCY DEPARTMENT Provider Note   CSN: [id number] Arrival date & time: 07/08/19  0857     History Chief Complaint  Patient presents with  . Dental Pain    [name] is a 39 y.o. male.  The history is provided by the patient and medical records. No language interpreter was used.  Dental Pain Associated symptoms: no fever      39 year old male presenting for evaluation of dental pain.  Patient reports he is here for dental pain.  He has had pain to his left upper and lower teeth for the past 6 to 7 months but worsened for the past 3 days.  Pain is described as a sharp throbbing achy sensation radiates towards his head, 14 out of 10.  He endorsed feeling chills, he has to move around to distract himself from the pain.  He has been taking Tylenol and Aleve without help.  He does not complain of any fever chills no nausea vomiting no hearing changes or neck pain.  He denies any recent injury.  He does report temperature sensitivity especially with hot temperature.  He has not had a chance to see a dentist.   Past Medical History:  Diagnosis Date  . Achilles tendon rupture 08/21/2013   left    There are no problems to display for this patient.   Past Surgical History:  Procedure Laterality Date  . ACHILLES TENDON SURGERY Left 08/27/2013   Procedure: REPAIR LEFT RUPTURED ACHILLES TENDON PRIMARY OPEN/PERCUTANEOUS ;  Surgeon: Renette Butters, MD;  Location: [facility];  Service: Orthopedics;  Laterality: Left;  . NO PAST SURGERIES         No family history on file.  Social History   Tobacco Use  . Smoking status: Current Every Day Smoker    Years: 2.00    Types: Cigarettes  . Smokeless tobacco: Never Used  . Tobacco comment: 2-3 cig./day  Substance Use Topics  . Alcohol use: Yes    Comment: socially  . Drug use: No    Home Medications Prior to Admission medications   Medication Sig Start Date End Date Taking?  Authorizing Provider  acetaminophen (TYLENOL) 325 MG tablet Take 2 tablets (650 mg total) by mouth every 6 (six) hours as needed. 02/12/17   Couture, Cortni S, PA-C  amoxicillin (AMOXIL) 500 MG capsule Take 1 capsule (500 mg total) by mouth 3 (three) times daily. 06/15/14   Margarita Mail, PA-C  naproxen (NAPROSYN) 375 MG tablet Take 1 tablet (375 mg total) by mouth 2 (two) times daily. 02/12/17   Couture, Cortni S, PA-C  oxyCODONE-acetaminophen (PERCOCET/ROXICET) 5-325 MG per tablet Take 1 tablet by mouth every 6 (six) hours as needed for moderate pain or severe pain. 06/15/14   Margarita Mail, PA-C    Allergies    Fish allergy  Review of Systems   Review of Systems  Constitutional: Negative for fever.  HENT: Positive for dental problem.     Physical Exam Updated Vital Signs BP (!) 162/108   Pulse 73   Temp 99.4 F (37.4 C) (Oral)   Resp 16   Ht 5\' 11"  (1.803 m)   Wt 76.2 kg   SpO2 98%   BMI 23.43 kg/m   Physical Exam Vitals and nursing note reviewed.  Constitutional:      General: He is not in acute distress.    Appearance: He is well-developed.  HENT:     Head: Atraumatic.  Mouth/Throat:     Comments: Widespread dental decay.  Decay most significant to tooth #14, 17 and 18 with tenderness to palpation but no obvious facial abscess. Eyes:     Conjunctiva/sclera: Conjunctivae normal.  Musculoskeletal:     Cervical back: Neck supple.  Skin:    Findings: No rash.  Neurological:     Mental Status: He is alert.     ED Results / Procedures / Treatments   Labs (all labs ordered are listed, but only abnormal results are displayed) Labs Reviewed - No data to display  EKG None  Radiology No results found.  Procedures Procedures (including critical care time)  Medications Ordered in ED Medications - No data to display  ED Course  I have reviewed the triage vital signs and the nursing notes.  Pertinent labs & imaging results that were available during my  care of the patient were reviewed by me and considered in my medical decision making (see chart for details).    MDM Rules/Calculators/A&P                          BP (!) 162/108   Pulse 73   Temp 99.4 F (37.4 C) (Oral)   Resp 16   Ht 5\' 11"  (1.803 m)   Wt 76.2 kg   SpO2 98%   BMI 23.43 kg/m   Final Clinical Impression(s) / ED Diagnoses Final diagnoses:  Pain due to dental caries    Rx / DC Orders ED Discharge Orders         Ordered    penicillin v potassium (VEETID) 500 MG tablet  3 times daily     Discontinue  Reprint     07/08/19 1137    ibuprofen (ADVIL) 600 MG tablet  Every 6 hours PRN     Discontinue  Reprint     07/08/19 1137         Pt presents c/o dental pain. There is no evidence of abscess, no trismus, and the uvula is midline.  The patient will be given prescriptions for antibiotics and analgesics.  The patient was counseled to follow up with a dentist for further treatment.  The patient was informed to return to the ED if there is interval development of hoarseness, trismus, erythema, difficulty breathing, swelling of the neck or worsening pain. The patient verbalized understanding of the plan and agrees.  The patient was provided the opportunity to ask questions.  All questions were answered.    07/10/19, PA-C 07/08/19 1139    07/10/19, MD 07/09/19 [phone]

## 2019-07-21 ENCOUNTER — Encounter (HOSPITAL_COMMUNITY): Payer: Self-pay | Admitting: Emergency Medicine

## 2019-07-21 ENCOUNTER — Encounter (HOSPITAL_COMMUNITY): Payer: Self-pay

## 2019-07-21 ENCOUNTER — Emergency Department (HOSPITAL_COMMUNITY)
Admission: EM | Admit: 2019-07-21 | Discharge: 2019-07-21 | Disposition: A | Discharge status: Home / Self Care | Payer: Self-pay | Attending: Emergency Medicine | Admitting: Emergency Medicine

## 2019-07-21 ENCOUNTER — Other Ambulatory Visit: Payer: Self-pay

## 2019-07-21 ENCOUNTER — Ambulatory Visit (HOSPITAL_COMMUNITY)
Admission: EM | Admit: 2019-07-21 | Discharge: 2019-07-21 | Disposition: A | Discharge status: Home / Self Care | Payer: Self-pay | Attending: Internal Medicine | Admitting: Internal Medicine

## 2019-07-21 DIAGNOSIS — Z5321 Procedure and treatment not carried out due to patient leaving prior to being seen by health care provider: Secondary | ICD-10-CM | POA: Insufficient documentation

## 2019-07-21 DIAGNOSIS — R21 Rash and other nonspecific skin eruption: Secondary | ICD-10-CM | POA: Insufficient documentation

## 2019-07-21 DIAGNOSIS — T7840XA Allergy, unspecified, initial encounter: Secondary | ICD-10-CM

## 2019-07-21 MED ORDER — PREDNISONE 20 MG PO TABS: 40.0000 mg | ORAL_TABLET | Freq: Every day | ORAL | 0 refills | Status: AC

## 2019-07-21 NOTE — ED Triage Notes (Addendum)
Pt states he was taking PCN for a tooth pulled for the past week and yesterday he developed swollen lips, itching skin. Pt stopped taking atx and took a benadryl this morning at 0700. Pt denies SOB. Pt has non labored breathing. Pt has 1+ swelling of lips

## 2019-07-21 NOTE — Discharge Instructions (Signed)
Take prednisone as prescribed. Continue Benadryl as needed for continued swelling and/or itching. Go to ER for any increase in swelling or shortness or breath.

## 2019-07-21 NOTE — ED Provider Notes (Addendum)
[facility]    CSN: [id number] Arrival date & time: 07/21/19  1643      History   Chief Complaint Chief Complaint  Patient presents with  . Allergic Reaction    HPI [name] is a 39 y.o. male otherwise healthy presents to urgent care with allergic reaction.  Patient reports lip swelling, swelling on forehead and hand itching that started this morning.  Symptoms have improved with Benadryl taken this morning.  Patient has not eaten anything unusual.  He did complete a course of penicillin yesterday for recent dental work.  Patient denies any tongue swelling, no shortness of breath.   Past Medical History:  Diagnosis Date  . Achilles tendon rupture 08/21/2013   left    There are no problems to display for this patient.   Past Surgical History:  Procedure Laterality Date  . ACHILLES TENDON SURGERY Left 08/27/2013   Procedure: REPAIR LEFT RUPTURED ACHILLES TENDON PRIMARY OPEN/PERCUTANEOUS ;  Surgeon: Sheral Apley, MD;  Location: [facility];  Service: Orthopedics;  Laterality: Left;  . NO PAST SURGERIES         Home Medications    Prior to Admission medications   Medication Sig Start Date End Date Taking? Authorizing Provider  acetaminophen (TYLENOL) 325 MG tablet Take 2 tablets (650 mg total) by mouth every 6 (six) hours as needed. 02/12/17   Couture, Cortni S, PA-C  ibuprofen (ADVIL) 600 MG tablet Take 1 tablet (600 mg total) by mouth every 6 (six) hours as needed. 07/08/19   Fayrene Helper, PA-C  penicillin v potassium (VEETID) 500 MG tablet Take 1 tablet (500 mg total) by mouth 3 (three) times daily. 07/08/19   Fayrene Helper, PA-C  predniSONE (DELTASONE) 20 MG tablet Take 2 tablets (40 mg total) by mouth daily for 5 days. 07/21/19 07/26/19  Rolla Etienne, NP    Family History No family history on file.  Social History Social History   Tobacco Use  . Smoking status: Current Every Day Smoker    Years: 2.00    Types: Cigars  .  Smokeless tobacco: Never Used  Substance Use Topics  . Alcohol use: Yes    Comment: socially  . Drug use: No     Allergies   Fish allergy and Penicillins   Review of Systems As stated in HPI otherwise negative   Physical Exam Triage Vital Signs ED Triage Vitals  Enc Vitals Group     BP 07/21/19 1742 125/80     Pulse Rate 07/21/19 1742 65     Resp 07/21/19 1742 16     Temp 07/21/19 1742 98.6 F (37 C)     Temp Source 07/21/19 1742 Oral     SpO2 07/21/19 1742 100 %     Weight 07/21/19 1746 154 lb (69.9 kg)     Height 07/21/19 1746 5\' 11"  (1.803 m)     Head Circumference --      Peak Flow --      Pain Score 07/21/19 1745 2     Pain Loc --      Pain Edu? --      Excl. in GC? --    No data found.  Updated Vital Signs BP 125/80   Pulse 65   Temp 98.6 F (37 C) (Oral)   Resp 16   Ht 5\' 11"  (1.803 m)   Wt 69.9 kg   SpO2 100%   BMI 21.48 kg/m   Physical Exam Constitutional:  Appearance: Normal appearance. He is normal weight.  HENT:     Head: Normocephalic and atraumatic.     Mouth/Throat:     Mouth: Mucous membranes are moist.     Pharynx: Oropharynx is clear.     Comments: No tongue swelling or erythema.  Lip swelling bottom greater than top Cardiovascular:     Rate and Rhythm: Normal rate and regular rhythm.  Pulmonary:     Effort: Pulmonary effort is normal.     Breath sounds: Normal breath sounds.  Musculoskeletal:     Cervical back: Normal range of motion and neck supple. No tenderness.  Skin:    General: Skin is warm and dry.  Neurological:     Mental Status: He is alert.      UC Treatments / Results  Labs (all labs ordered are listed, but only abnormal results are displayed) Labs Reviewed - No data to display  EKG   Radiology No results found.  Procedures Procedures (including critical care time)  Medications Ordered in UC Medications - No data to display  Initial Impression / Assessment and Plan / UC Course  I have  reviewed the triage vital signs and the nursing notes.  Pertinent labs & imaging results that were available during my care of the patient were reviewed by me and considered in my medical decision making (see chart for details).  Allergic Reaction -Suspect secondary to penicillin, which patient was taking for recent dental work -Symptoms improved with Benadryl taken prior to visit.  Continue Benadryl as needed -Short prednisone burst -Return or go to ER for any increased swelling or shortness of breath  Final Clinical Impressions(s) / UC Diagnoses   Final diagnoses:  Allergic reaction, initial encounter     Discharge Instructions     Take prednisone as prescribed. Continue Benadryl as needed for continued swelling and/or itching. Go to ER for any increase in swelling or shortness or breath.    ED Prescriptions    Medication Sig Dispense Auth. Provider   predniSONE (DELTASONE) 20 MG tablet Take 2 tablets (40 mg total) by mouth daily for 5 days. 5 tablet Rolla Etienne, NP     PDMP not reviewed this encounter.   Rolla Etienne, NP 07/22/19 1719    Rolla Etienne, NP 07/22/19 2006

## 2019-07-21 NOTE — ED Notes (Signed)
Pt decided to leave the emergency department 

## 2019-07-21 NOTE — ED Triage Notes (Signed)
Pt c/o rash and lip swelling that started yesterday after taking penicillin. Pt reports he took a benadryl this morning with improvement. Denies shortness of breath/chest pain.

## 2019-09-06 ENCOUNTER — Encounter: Admit: 2019-09-06 | Payer: PRIVATE HEALTH INSURANCE | Attending: Internal Medicine | Primary: Internal Medicine

## 2019-09-07 MED ORDER — TRIAMTERENE 37.5 MG-HYDROCHLOROTHIAZIDE 25 MG TABLET
37.5-25 mg | ORAL_TABLET | Freq: Every day | ORAL | 4 refills | Status: AC
Start: 2019-09-07 — End: 2020-10-02

## 2019-09-07 NOTE — Telephone Encounter
erxd

## 2019-11-05 ENCOUNTER — Encounter: Admit: 2019-11-05 | Payer: PRIVATE HEALTH INSURANCE | Attending: Internal Medicine | Primary: Internal Medicine

## 2019-11-16 ENCOUNTER — Encounter: Admit: 2019-11-16 | Payer: PRIVATE HEALTH INSURANCE | Attending: Internal Medicine | Primary: Internal Medicine

## 2019-11-23 ENCOUNTER — Ambulatory Visit: Admit: 2019-11-23 | Payer: PRIVATE HEALTH INSURANCE | Attending: Hematology and Oncology | Primary: Internal Medicine

## 2019-11-24 ENCOUNTER — Telehealth: Admit: 2019-11-24 | Payer: PRIVATE HEALTH INSURANCE | Attending: Hematology and Oncology | Primary: Internal Medicine

## 2019-11-24 NOTE — Telephone Encounter
LM for pt to call back to r/s missed appt

## 2019-11-24 NOTE — Telephone Encounter
appt for 11.2 still showing as scheduled, pt might of no showed might need rescheduling.

## 2019-12-15 ENCOUNTER — Encounter: Admit: 2019-12-15 | Payer: PRIVATE HEALTH INSURANCE | Attending: Hematology and Oncology | Primary: Internal Medicine

## 2019-12-17 ENCOUNTER — Encounter: Admit: 2019-12-17 | Payer: PRIVATE HEALTH INSURANCE | Attending: Hematology and Oncology | Primary: Internal Medicine

## 2019-12-17 DIAGNOSIS — I2699 Other pulmonary embolism without acute cor pulmonale: Secondary | ICD-10-CM

## 2019-12-17 MED ORDER — RIVAROXABAN 20 MG TABLET
20 mg | ORAL_TABLET | Freq: Every day | ORAL | 5 refills | Status: AC
Start: 2019-12-17 — End: 2021-01-12

## 2019-12-17 NOTE — Telephone Encounter
Prescription refill generated for Xarelto. Once signed will be e-prescribed to Patient's requested [employer].

## 2019-12-17 NOTE — Telephone Encounter
Pt called for refill on his xerlato 20 mg, has no more refills and needs it for tomorrow would like it to be called in today.  uses. [id number] - [place], [place] - [address].

## 2020-02-18 ENCOUNTER — Encounter: Admit: 2020-02-18 | Payer: PRIVATE HEALTH INSURANCE | Attending: Internal Medicine | Primary: Internal Medicine

## 2020-02-18 ENCOUNTER — Ambulatory Visit: Admit: 2020-02-18 | Payer: BLUE CROSS/BLUE SHIELD | Attending: Internal Medicine | Primary: Internal Medicine

## 2020-02-18 DIAGNOSIS — F1020 Alcohol dependence, uncomplicated: Secondary | ICD-10-CM

## 2020-02-18 DIAGNOSIS — I7300 Raynaud's syndrome without gangrene: Secondary | ICD-10-CM

## 2020-02-18 DIAGNOSIS — E559 Vitamin D deficiency, unspecified: Secondary | ICD-10-CM

## 2020-02-18 DIAGNOSIS — I2699 Other pulmonary embolism without acute cor pulmonale: Secondary | ICD-10-CM

## 2020-02-18 DIAGNOSIS — I10 Essential (primary) hypertension: Secondary | ICD-10-CM

## 2020-02-18 DIAGNOSIS — Z0000 Encounter for general adult medical examination without abnormal findings: Secondary | ICD-10-CM

## 2020-02-18 DIAGNOSIS — M109 Gout, unspecified: Secondary | ICD-10-CM

## 2020-02-18 DIAGNOSIS — D6861 Antiphospholipid syndrome: Secondary | ICD-10-CM

## 2020-02-18 NOTE — Progress Notes
Name:Dalton McLaughlinDOB:  [birth year] MRN:  [id number] History of Present Illness:Dr.  Merlinda Frederick had concerns including Annual Exam.He is feeling well. BP at home similar to reading today. Rarely will have some transient orthostatic sxs. Eating very healthful vegetarian diet. Walks 1- 2 miles per day. No complications with DOAC use.Raynauds phenomena continuesFound to have Vit D def with labs done by Nephrology, so took Vit D3 5K units daily for awhile and now on 2K units daily .Medical History:He  has a past medical history of Alcoholism (HC Code) (HC CODE), Antiphospholipid antibody syndrome (HC Code) (HC CODE), Bilateral pulmonary embolism (HC Code) (HC CODE) (jan 2012), Gout, Hypertension, Raynaud phenomenon (02/18/2020), and Vitamin D deficiency (02/18/2020).Allergies:He has No Known Allergies.Medications:He has a current medication list which includes the following prescription(s): amLODIPine (NORVASC) 10 mg tablet - Take 1 tablet (10 mg total) by mouth daily, lisinopriL (PRINIVIL,ZESTRIL) 10 mg tablet - Take 1 tablet (10 mg total) by mouth daily, rivaroxaban (XARELTO) 20 mg tablet - Take 1 tablet (20 mg total) by mouth Daily @1700 , and triamterene-hydroCHLOROthiazide (MAXZIDE-25) 37.5-25 mg per tablet - Take 1 tablet by mouth daily.Social History:He  reports that he has never smoked. He has never used smokeless tobacco. He reports that he does not drink alcohol and does not use drugs.Review of SystemsNo cardiac or pulmonary symptoms. No gastrointestinal or genito-urinary symptomsPhysical Examination: BP 112/72 (Site: l a, Position: Sitting, Cuff Size: Medium)  - Pulse 60  - Temp 97.7 ?F (36.5 ?C)  - Wt 78.5 kg  - SpO2 100%  - BMI 23.46 kg/m? General Appearance:  Alert, no distress Head:  Normocephalic, atraumatic Neck: Supple, no adenopathy Lungs:   Clear to auscultation bilaterally Heart:  Regular rate and rhythm, normal S1/S2, no murmurs Abdomen:   Soft, non-tender, normal bowel sounds, no masses Extremities: No edema   Assessment and Plan:Dr. Recore is stable. Given weight loss and current BP readings, we will trial reduction in meds with discontinuation of Lisinopril. Will check BP and report and obtain labs below several weeks after change. Orders Placed This Encounter Procedures ? Lipid panel ? Vitamin D 25 (vitamin D status)([facility])(YH) ? CBC and differential ? Comprehensive metabolic panel Electronically signed by Venia Minks, MD

## 2020-03-05 ENCOUNTER — Encounter: Admit: 2020-03-05 | Payer: PRIVATE HEALTH INSURANCE | Attending: Internal Medicine | Primary: Internal Medicine

## 2020-03-06 MED ORDER — AMLODIPINE 10 MG TABLET
10 mg | ORAL_TABLET | 4 refills | Status: AC
Start: 2020-03-06 — End: 2021-02-28

## 2020-03-06 NOTE — Telephone Encounter
erxd

## 2020-03-09 ENCOUNTER — Encounter: Admit: 2020-03-09 | Payer: PRIVATE HEALTH INSURANCE | Attending: Internal Medicine | Primary: Internal Medicine

## 2020-03-14 ENCOUNTER — Ambulatory Visit: Admit: 2020-03-14 | Payer: BLUE CROSS/BLUE SHIELD | Attending: Hematology and Oncology | Primary: Internal Medicine

## 2020-03-14 DIAGNOSIS — I2699 Other pulmonary embolism without acute cor pulmonale: Secondary | ICD-10-CM

## 2020-03-14 NOTE — Progress Notes
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  He complained on SOB and horrible pain in the left side of his chest and back.  He noted dizziness and tachycardia with ambulation.  CTA showed bilateral lower lobe pulmonary emboli with bilateral lower lobe peripheral opacities suggestive of infarcts.  Incompletely visualized, prominent appearing spleen was noted.  ?Bilateral lower extremity US was negative for DVT and abdominal US showed splenomegaly with the spleen measuring 15 cm in length.  There was no evidence of portal vein thrombosis.  TTE was a technically limited study.  Joe had normal left ventricular size.  Normal wall thickness.  Normal systolic function.  Estimated ejection fraction was 59%.  Normal diastolic function.  Poor visualization of the right ventricle, difficult to determine size and function.  No significant valvular disease.  No significant pericardial effusion.?He had no obvious risk factors for PE:  no surgeries or immobilization, no known hypercoagulable disorders, though he does have an uncle who had two episodes of unprovoked PE.  The patient was worked up for hypercoagulable state and had normal protein C, protein S, and antithrombin activity.  The workup returned back positive for lupus anticoagulant (phospholipid neutralization as well as prolonged dilute Russell viper venom time of 1.27 (nl < 1.21)).  The patient also had elevated anticardiolipin antibody with IgM antibody measuring at 138 MPL.  Beta-2 glycoprotein-1 IgM antibody was elevated at 52 MPL.  The patient was also found to have heterozygosity for factor V Leiden mutation and did not have prothrombin gene mutation.  Interestingly, APC resistance was WNL.  His homocystine level was within normal limits.  ANA was negative, RF was 32 and JAK-2 V617F mutation was negative. Repeat testing for APLs including B2GP-1 Ab, ACL Ab and LA was negative in 5/12 and 11/12. ?Treatment with intravenous heparin followed by subcutaneous Fragmin and Coumadin.  He was evaluated by Pulmonary Service for possible TPA, but was found not to have right ventricular stain by transthoracic echocardiogram done in the emergency department.  Cardiac enzymes were within normal limits.  He was discharged from the hospital on Fragmin and Coumadin on 02/24/2010.?He does have frequent gout episodes; previously, they were once a year, but over the five week period since January of 2012, he experienced four episodes.  In the past, he was taking ibuprofen as well as steroids and colchicine for this issue.  He was evaluated by a rheumatologist in [place] a while ago.  He never had arthrocentesis to confirm the diagnosis, but responded appropriately to above mentioned treatment and also is known to have elevated uric acid, in the past it was up to 16.?Patient was seen by Dr. Feliberto Gottron in pulmonary clinic in 7/12 and was discharge from the clinic as he is at low risk for the development of chronic thromboembolic pulmonary hypertension or chronic cardiopulmonary complications from his pulmonary embolism.?He was admitted to the [facility] ICU in 2/13 with alcohol withdrawal requiring benzodiazepine drip.  After admission he went to [facility] facility in [place] where he stayed until 4/13.  He returned back to work on 5/13. Presently, he is completely abstinent.  He is in a medically-monitored program with random urine testing and ongoing support groups including AA and a physician's group. There is a very strong family history of alcoholism in multiple family members.  ?INTERIM HISTORY:  The patient presents for a yealry followup visit without any complaints. He was last seen on 11/20. No complains. He is now on 3 meds for BP with better control. Lisinopril was stopped recently. Patient was seen by nephrologist in 5/21 - stable Cr of 1.26 with likely reason for elevation being HTN. ?REVIEW OF ORGANS AND SYSTEMS:  No fevers fevers, sweats or weight loss. No bleeding. Otherwise, unremarkable.?MEDICATIONS:  Current Outpatient Medications on File Prior to Visit Medication Sig Dispense Refill ? amLODIPine (NORVASC) 10 mg tablet TAKE 1 TABLET(10 MG) BY MOUTH DAILY 90 tablet 3 ? lisinopriL (PRINIVIL,ZESTRIL) 10 mg tablet Take 1 tablet (10 mg total) by mouth daily. (Patient not taking: Reported on 03/14/2020) 90 tablet 3 ? rivaroxaban (XARELTO) 20 mg tablet Take 1 tablet (20 mg total) by mouth Daily @1700 . 90 tablet 4 ? triamterene-hydroCHLOROthiazide (MAXZIDE-25) 37.5-25 mg per tablet Take 1 tablet by mouth daily. 90 tablet 3 No current facility-administered medications on file prior to visit. PHYSICAL EXAMINATION:  This is a 40 year old male in no acute distress.  BP 138/82  - Pulse 61  - Temp 97.7 ?F (36.5 ?C) (Oral)  - Resp 19  - Ht 6' (1.829 m)  - Wt 80.2 kg  - SpO2 100%  - BMI 23.98 kg/m? Neck:  Supple without JVD or lymphadenopathy.  Cardiovascular Exam: Regular rate and rhythm.  No murmurs.  Lungs are clear to auscultation.  Abdomen is nontender and nondistended with positive bowel sounds without palpable organomegaly.  Extremities:  Warm and well perfused.  No edema.  Neurological Exam:  Alert and oriented x3.  Overall, nonfocal exam.  Skin is without rashes.  Lymph node survey is unrevealing.ECOG PS is 0?LABS: Ref. Range 06/07/2019 15:19 Sodium Latest Ref Range: 136 - 144 mmol/L 134 (L) Potassium Latest Ref Range: 3.3 - 5.1 mmol/L 4.4 Chloride Latest Ref Range: 98 - 107 mmol/L 98 CO2 Latest Ref Range: 20 - 30 mmol/L 27 Anion Gap Latest Ref Range: 7 - 17  9 BUN Latest Ref Range: 6 - 20 mg/dL 14 Creatinine Latest Ref Range: 0.40 - 1.30 mg/dL 1.61 BUN/Creatinine Ratio Latest Ref Range: 8.0 - 23.0  11.1 eGFR (NON African-American) Latest Ref Range: >60 mL/min/1.64m2 >60 eGFR (Afr Amer) Latest Ref Range: >60 mL/min/1.47m2 >60 Glucose Latest Ref Range: 70 - 100 mg/dL 83 Calcium Latest Ref Range: 8.8 - 10.2 mg/dL 9.0 Phosphorus Latest Ref Range: 2.2 - 4.5 mg/dL 3.7 Total Bilirubin Latest Ref Range: <=1.2 mg/dL 0.5 Alkaline Phosphatase Latest Ref Range: 9 - 122 U/L 52 Alanine Aminotransferase (ALT) Latest Ref Range: 9 - 59 U/L 14 Aspartate Aminotransferase (AST) Latest Ref Range: 10 - 35 U/L 17 AST/ALT Ratio Latest Ref Range: See Comment  1.2 Total Protein Latest Ref Range: 6.6 - 8.7 g/dL 6.8 Albumin Latest Ref Range: 3.6 - 4.9 g/dL 4.7 Globulin Latest Ref Range: 2.3 - 3.5 g/dL 2.1 (L) A/G Ratio Latest Ref Range: 1.0 - 2.2  2.2 Parathyroid Hormone, Intact Latest Ref Range: 15.0 - 65.0 pg/mL 62.3 Vitamin D25-Hydroxy Latest Ref Range: 20 - 50 ng/mL 10 (L) Creatinine, Urine, Random Latest Ref Range: Reference Range Not Established mg/dL 64 WBC Latest Ref Range: 4.0 - 10.0 x1000/?L 5.1 RBC Latest Ref Range: 3.8 -  5.9 M/?L 3.9 Hemoglobin Latest Ref Range: 12.0 - 18.0 g/dL 16.1 Hematocrit Latest Ref Range: 37.0 - 52.0 % 37.1 MCV Latest Ref Range: 78.0 - 94.0 fL 95.4 (H) MCH Latest Ref Range: 27.0 - 31.0 pg 32.1 (H) MCHC Latest Ref Range: 31.0 - 36.0 g/dL 09.6 RDW-CV Latest Ref Range: 11.5 - 14.5 % 11.7 Platelets Latest Ref Range: 140 - 440 x1000/?L 244 MPV Latest Ref Range: 6.0 - 11.0 fL 10.6 Neutrophils Latest Ref Range: 37.0 - 84.0 % 58.2 Lymphocytes Latest Ref Range: 8.0 - 49.0 % 30.2 Monocytes Latest Ref Range: 4.0 - 15.0 % 6.5 Eosinophils Latest Ref Range: 0.0 - 7.0 % 4.1 Basophils Latest Ref Range: 0.0 - 4.0 % 0.8 Immature Granulocytes Latest Ref Range: 0.0 - 3.0 % 0.2 nRBC Latest Ref Range: 0.0 - 1.0 % 0.0 ANC (Abs Neutrophil Count) Latest Ref Range: 1.0 - 11.0 x 1000/?L 3.0 Absolute Lymphocyte Count Latest Ref Range: 1.0 - 4.0 x 1000/?L 1.5 Monocytes (Absolute) Latest Ref Range: 0.0 - 2.0 x 1000/?L 0.3 Eosinophil Absolute Count Latest Ref Range: 0.0 - 1.0 x 1000/?L 0.2 Basophils Absolute Latest Ref Range: 0.0 - 0.0 x 1000/?L 0.0 Immature Granulocytes (Abs) Latest Ref Range: 0.0 - 0.3 x 1000/?L 0.0 nRBC Absolute Latest Ref Range: 0.0 - 0.0 x 1000/?L 0.0 Clarity, UA Latest Ref Range: Clear  Clear Specific Gravity, UA Latest Ref Range: 1.005 - 1.030  1.012 pH, UA Latest Ref Range: 5.5 - 7.5  6.0 Protein, UA Latest Ref Range: Negative-Trace  Negative Glucose, UA Latest Ref Range: Negative  Negative Ketones, UA Latest Ref Range: Negative  Negative Blood, UA Latest Ref Range: Negative  Negative Bilirubin, UA Latest Ref Range: Negative  Negative Leukocytes, UA Latest Ref Range: Negative  Negative Nitrite, UA Latest Ref Range: Negative  Negative Color, UA Latest Ref Range: Yellow  Yellow Urobilinogen, UA Latest Ref Range: <=2.0 EU/dL <0.4 Albumin, Urine, Random Latest Ref Range: Reference Range Not Established mg/L <12.0 Albumin/Creatinine Ratio, Urine, Random Unknown Comment Only 07/02/11 PBS INTERPRETATION: Relative lymphocytosis with numerous atypical lymphocytes; while the lymphocytes appear mostly as activated/atypical forms, recommend flow cytometry studies to definitively evaluate lymphocyte clonality. Rare enlarged platelets. 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  His testing reveal heterozygous factor V Leiden mutation and he was also transiently positive for anticardiolipin IgG antibody and beta-2 glycoprotein IgG antibody in high titers.  He also was positive for lupus anticoagulant.  Repeat APLs testing was negative in 5/12 and 11/12. ?Taking into consideration unprovoked nature of his VTE as well as presentation withy extensive pulmonary embolism, as well as his male gender, the decision was made to continue anticoagulation long-term. He was initially on Coumadin and was switched to rivaroxaban 20 mg daily with meals in 11/12. He tolerates the medication without problems and continues to take it daily. D-dimer tested on 07/02/11 and was WNL. He agrees to continue with anticoagulation after discussing the issue of duration of anticoagulation taking in to consideration unprovoked nature of the VTE, presentation as bilateral PE and male gender (higher risk of recurrence and potential for recurrence as PE). ?He was noticed to have macrocytosis in the past which was though to be related to alcohol. He remained slightly macrocytic until 12/12 even though he is not drinking alcohol since 2/13. B12 level was below 500, but RBC folate, methylmalonic acid and homocystine levels were WNL.  PBS was only significant for mild macrocytosis.   He did have leukopenia and neutropenia in the past both resolved.  He does have mild splenomegaly, which was 15 cm in length by ultrasound performed on 12/22/2010. His Hct was 37% when tested on 06/24/11 but is normal since 12/12. His iron panel including ferritin were normal as well. CBC will be repeated after this visit. ?Due to HTN he remains on Norvasc 10 mg po daily, Maxzide  and is followed by Dr. [name]. His last Cr was elevated but stable at 1.26. His Cr clearance was > 60 when last tested in 5/21 and he continues with the current dose of Rivaroxiban. CMP testing will be repeated after this visit If Cr clearance is < 30 rivaroxiban has to be discontinued. We discussed the data supporting better outcomes among APLs patient with thrombosis treated with warfarin rather than DOACs but taking in to consideration no thrombotic events since treatment initiation in 2012 we agreed to continue with current treatment. ?Refill for Xarelto was provided in 11/21. He will come for a followup visit in 12 months' time.Total time spent by the provider on the day of service, which includes time spent on chart review, in-person interaction with the patient, physical exam, education, coordination of care/services and counseling was 21 min. Delson Dulworth A. Cherlynn Perches, MD, PhD

## 2020-05-06 ENCOUNTER — Inpatient Hospital Stay: Admit: 2020-05-06 | Discharge: 2020-05-06 | Payer: BLUE CROSS/BLUE SHIELD | Primary: Internal Medicine

## 2020-05-06 DIAGNOSIS — E559 Vitamin D deficiency, unspecified: Secondary | ICD-10-CM

## 2020-05-06 DIAGNOSIS — I10 Essential (primary) hypertension: Secondary | ICD-10-CM

## 2020-05-06 DIAGNOSIS — Z0000 Encounter for general adult medical examination without abnormal findings: Secondary | ICD-10-CM

## 2020-05-06 LAB — COMPREHENSIVE METABOLIC PANEL
BKR ALANINE AMINOTRANSFERASE (ALT): 11 U/L (ref 9–59)
BKR ALBUMIN: 4.7 g/dL (ref 3.6–4.9)
BKR ALKALINE PHOSPHATASE: 59 U/L (ref 9–122)
BKR ANION GAP: 9 g/dL (ref 7–17)
BKR ASPARTATE AMINOTRANSFERASE (AST): 16 U/L (ref 10–35)
BKR AST/ALT RATIO: 1.5
BKR BILIRUBIN TOTAL: 0.8 mg/dL (ref <=1.2)
BKR BLOOD UREA NITROGEN: 14 mg/dL (ref 6–20)
BKR BUN / CREAT RATIO: 11.7 % (ref 8.0–23.0)
BKR CALCIUM: 9.6 mg/dL (ref 8.8–10.2)
BKR CHLORIDE: 102 mmol/L (ref 98–107)
BKR CO2: 32 mmol/L — ABNORMAL HIGH (ref 20–30)
BKR CREATININE: 1.2 mg/dL (ref 0.40–1.30)
BKR EGFR (AFR AMER): >60 mL/min/{1.73_m2} (ref >60)
BKR EGFR (NON AFRICAN AMERICAN): >60 mL/min/{1.73_m2} (ref >60)
BKR GLOBULIN: 2.2 g/dL — ABNORMAL LOW (ref 2.3–3.5)
BKR GLUCOSE: 89 mg/dL (ref 70–100)
BKR POTASSIUM: 3.7 mmol/L (ref 3.3–5.3)
BKR PROTEIN TOTAL: 6.9 g/dL (ref 6.6–8.7)
BKR SODIUM: 143 mmol/L (ref 136–144)

## 2020-05-06 LAB — LIPID PANEL
BKR CHOLESTEROL/HDL RATIO: 2.5 (ref 0.0–5.0)
BKR CHOLESTEROL: 149 mg/dL
BKR HDL CHOLESTEROL: 59 mg/dL (ref >=40)
BKR LDL CHOLESTEROL CALCULATED: 72 mg/dL
BKR TRIGLYCERIDES: 92 mg/dL

## 2020-05-06 LAB — CBC WITH AUTO DIFFERENTIAL
BKR WAM ABSOLUTE IMMATURE GRANULOCYTES.: 0.01 x 1000/??L (ref 0.00–0.30)
BKR WAM ABSOLUTE LYMPHOCYTE COUNT.: 1.7 x 1000/??L (ref 0.60–3.70)
BKR WAM ABSOLUTE NRBC (2 DEC): 0 x 1000/??L (ref 0.00–1.00)
BKR WAM ANALYZER ANC: 3.07 x 1000/??L (ref 2.00–7.60)
BKR WAM BASOPHIL ABSOLUTE COUNT.: 0.03 x 1000/??L (ref 0.00–1.00)
BKR WAM BASOPHILS: 0.6 % (ref 0.0–1.4)
BKR WAM EOSINOPHIL ABSOLUTE COUNT.: 0.11 x 1000/??L (ref 0.00–1.00)
BKR WAM EOSINOPHILS: 2.1 % (ref 0.0–5.0)
BKR WAM HEMOGLOBIN: 14.1 g/dL (ref 13.2–17.1)
BKR WAM IMMATURE GRANULOCYTES: 0.2 % (ref 0.0–1.0)
BKR WAM LYMPHOCYTES: 32.4 % (ref 17.0–50.0)
BKR WAM MCH (PG): 32.4 pg (ref 27.0–33.0)
BKR WAM MCHC: 35.4 g/dL (ref 31.0–36.0)
BKR WAM MCV: 91.5 fL (ref 80.0–100.0)
BKR WAM MONOCYTE ABSOLUTE COUNT.: 0.33 x 1000/??L (ref 0.00–1.00)
BKR WAM MONOCYTES: 6.3 % (ref 4.0–12.0)
BKR WAM MPV: 11.3 fL (ref 8.0–12.0)
BKR WAM NEUTROPHILS: 58.4 % (ref 39.0–72.0)
BKR WAM NUCLEATED RED BLOOD CELLS: 0 % (ref 0.0–1.0)
BKR WAM PLATELETS: 253 x1000/??L (ref 150–420)
BKR WAM RDW-CV: 11.4 % (ref 11.0–15.0)
BKR WAM RED BLOOD CELL COUNT.: 4.35 M/??L (ref 4.00–6.00)
BKR WAM WHITE BLOOD CELL COUNT: 5.3 x1000/??L (ref 4.0–11.0)

## 2020-05-08 LAB — VITAMIN D 25 (VITAMIN D STATUS)(MINMETABLAB)(YH): VITAMIN D 25-HYDROXY: 45 ng/mL (ref 20–50)

## 2020-06-05 ENCOUNTER — Ambulatory Visit: Admit: 2020-06-05 | Payer: BLUE CROSS/BLUE SHIELD | Attending: Nephrology | Primary: Internal Medicine

## 2020-06-13 ENCOUNTER — Ambulatory Visit: Admit: 2020-06-13 | Payer: BLUE CROSS/BLUE SHIELD | Attending: Nephrology | Primary: Internal Medicine

## 2020-08-11 ENCOUNTER — Encounter: Admit: 2020-08-11 | Payer: PRIVATE HEALTH INSURANCE | Primary: Internal Medicine

## 2020-09-05 ENCOUNTER — Other Ambulatory Visit: Payer: Self-pay

## 2020-09-05 ENCOUNTER — Encounter (HOSPITAL_COMMUNITY): Payer: Self-pay

## 2020-09-05 ENCOUNTER — Ambulatory Visit (HOSPITAL_COMMUNITY)
Admission: EM | Admit: 2020-09-05 | Discharge: 2020-09-05 | Disposition: A | Discharge status: Home / Self Care | Payer: 59 | Attending: Emergency Medicine | Admitting: Emergency Medicine

## 2020-09-05 DIAGNOSIS — H9203 Otalgia, bilateral: Secondary | ICD-10-CM | POA: Insufficient documentation

## 2020-09-05 DIAGNOSIS — J0120 Acute ethmoidal sinusitis, unspecified: Secondary | ICD-10-CM | POA: Diagnosis not present

## 2020-09-05 DIAGNOSIS — Z20822 Contact with and (suspected) exposure to covid-19: Secondary | ICD-10-CM | POA: Diagnosis not present

## 2020-09-05 DIAGNOSIS — G43809 Other migraine, not intractable, without status migrainosus: Secondary | ICD-10-CM | POA: Diagnosis not present

## 2020-09-05 LAB — SARS CORONAVIRUS 2 (TAT 6-24 HRS): SARS Coronavirus 2: NEGATIVE

## 2020-09-05 MED ORDER — PREDNISONE 10 MG (21) PO TBPK: ORAL_TABLET | Freq: Every day | ORAL | 0 refills | Status: DC

## 2020-09-05 NOTE — Discharge Instructions (Addendum)
Check my chart for results  Stay hydrated well  Return back to work after 5 days if test positive

## 2020-09-05 NOTE — ED Triage Notes (Signed)
Pt in with c/o headache and congestion x 1 week   Pt has not had any otc meds for sx

## 2020-09-05 NOTE — ED Provider Notes (Signed)
[facility]    CSN: [id number] Arrival date & time: 09/05/20  9924      History   Chief Complaint Chief Complaint  Patient presents with   Nasal Congestion   Headache    HPI [name] is a 40 y.o. male.   Pt states that 1 week ago he had 20 people test positive for covid. Has noticed sx of headache, nasal pressure, congestion, and ear fullness. He has not had coughing as much, no sob. Has had both vaccine and booster. Has not taken anything pta.    Past Medical History:  Diagnosis Date   Achilles tendon rupture 08/21/2013   left    There are no problems to display for this patient.   Past Surgical History:  Procedure Laterality Date   ACHILLES TENDON SURGERY Left 08/27/2013   Procedure: REPAIR LEFT RUPTURED ACHILLES TENDON PRIMARY OPEN/PERCUTANEOUS ;  Surgeon: Sheral Apley, MD;  Location: [facility];  Service: Orthopedics;  Laterality: Left;   NO PAST SURGERIES         Home Medications    Prior to Admission medications   Medication Sig Start Date End Date Taking? Authorizing Provider  predniSONE (STERAPRED UNI-PAK 21 TAB) 10 MG (21) TBPK tablet Take by mouth daily. Take 6 tabs by mouth daily  for 2 days, then 5 tabs for 2 days, then 4 tabs for 2 days, then 3 tabs for 2 days, 2 tabs for 2 days, then 1 tab by mouth daily for 2 days 09/05/20  Yes Coralyn Mark, NP  acetaminophen (TYLENOL) 325 MG tablet Take 2 tablets (650 mg total) by mouth every 6 (six) hours as needed. 02/12/17   Couture, Cortni S, PA-C  ibuprofen (ADVIL) 600 MG tablet Take 1 tablet (600 mg total) by mouth every 6 (six) hours as needed. 07/08/19   Fayrene Helper, PA-C  penicillin v potassium (VEETID) 500 MG tablet Take 1 tablet (500 mg total) by mouth 3 (three) times daily. 07/08/19   Fayrene Helper, PA-C    Family History History reviewed. No pertinent family history.  Social History Social History   Tobacco Use   Smoking status: Every Day    Types: Cigars    Smokeless tobacco: Never  Vaping Use   Vaping Use: Never used  Substance Use Topics   Alcohol use: Yes    Comment: socially   Drug use: No     Allergies   Fish allergy and Penicillins   Review of Systems Review of Systems  Constitutional:  Positive for fatigue. Negative for appetite change, chills and fever.  HENT:  Positive for congestion, ear pain, postnasal drip, rhinorrhea, sinus pressure, sinus pain, sneezing and sore throat. Negative for ear discharge.   Eyes: Negative.   Respiratory:  Positive for cough. Negative for shortness of breath.   Cardiovascular: Negative.   Gastrointestinal: Negative.   Genitourinary: Negative.   Neurological:  Positive for headaches.    Physical Exam Triage Vital Signs ED Triage Vitals  Enc Vitals Group     BP 09/05/20 1014 120/83     Pulse Rate 09/05/20 1014 61     Resp 09/05/20 1013 19     Temp 09/05/20 1013 98 F (36.7 C)     Temp Source 09/05/20 1013 Oral     SpO2 09/05/20 1014 100 %     Weight --      Height --      Head Circumference --      Peak Flow --  Pain Score 09/05/20 1013 0     Pain Loc --      Pain Edu? --      Excl. in GC? --    No data found.  Updated Vital Signs BP 120/83 (BP Location: Right Arm)   Pulse 61   Temp 98 F (36.7 C) (Oral)   Resp 19   SpO2 100%   Visual Acuity Right Eye Distance:   Left Eye Distance:   Bilateral Distance:    Right Eye Near:   Left Eye Near:    Bilateral Near:     Physical Exam Constitutional:      Appearance: He is well-developed.  HENT:     Head: Normocephalic.     Nose: Nasal tenderness, congestion and rhinorrhea present. Rhinorrhea is purulent.     Right Turbinates: Enlarged and swollen.     Left Turbinates: Enlarged and swollen.     Right Sinus: Frontal sinus tenderness present.     Left Sinus: Frontal sinus tenderness present.     Mouth/Throat:     Mouth: Mucous membranes are moist.  Eyes:     Pupils: Pupils are equal, round, and reactive to light.   Cardiovascular:     Rate and Rhythm: Normal rate.  Pulmonary:     Effort: Pulmonary effort is normal.  Abdominal:     Palpations: Abdomen is soft.  Musculoskeletal:        General: Normal range of motion.     Cervical back: Normal range of motion.  Skin:    General: Skin is warm.  Neurological:     Mental Status: He is alert.     GCS: GCS eye subscore is 4. GCS verbal subscore is 5. GCS motor subscore is 6.     UC Treatments / Results  Labs (all labs ordered are listed, but only abnormal results are displayed) Labs Reviewed  SARS CORONAVIRUS 2 (TAT 6-24 HRS)    EKG   Radiology No results found.  Procedures Procedures (including critical care time)  Medications Ordered in UC Medications - No data to display  Initial Impression / Assessment and Plan / UC Course  I have reviewed the triage vital signs and the nursing notes.  Pertinent labs & imaging results that were available during my care of the patient were reviewed by me and considered in my medical decision making (see chart for details).     Discussed risk of having covid  Avoid contact till results return on my chart  Take motrin as needed for pain  Stay hydrated well  If worse will need to go to er  Final Clinical Impressions(s) / UC Diagnoses   Final diagnoses:  Other migraine without status migrainosus, not intractable  Acute non-recurrent ethmoidal sinusitis  Ear pain, bilateral     Discharge Instructions      Check my chart for results  Stay hydrated well  Return back to work after 5 days if test positive      ED Prescriptions     Medication Sig Dispense Auth. Provider   predniSONE (STERAPRED UNI-PAK 21 TAB) 10 MG (21) TBPK tablet Take by mouth daily. Take 6 tabs by mouth daily  for 2 days, then 5 tabs for 2 days, then 4 tabs for 2 days, then 3 tabs for 2 days, 2 tabs for 2 days, then 1 tab by mouth daily for 2 days 42 tablet Coralyn Mark, NP      PDMP not reviewed this  encounter.   Clovis Riley,  Inez Pilgrim, NP 09/05/20 1059

## 2020-10-01 ENCOUNTER — Encounter: Admit: 2020-10-01 | Payer: PRIVATE HEALTH INSURANCE | Attending: Internal Medicine | Primary: Internal Medicine

## 2020-10-02 MED ORDER — TRIAMTERENE 37.5 MG-HYDROCHLOROTHIAZIDE 25 MG TABLET
ORAL_TABLET | Freq: Every day | ORAL | 4 refills | Status: AC
Start: 2020-10-02 — End: 2021-09-26

## 2020-10-02 NOTE — Telephone Encounter
erxd

## 2020-10-06 ENCOUNTER — Encounter: Admit: 2020-10-06 | Payer: PRIVATE HEALTH INSURANCE | Primary: Internal Medicine

## 2020-11-30 ENCOUNTER — Encounter: Admit: 2020-11-30 | Payer: PRIVATE HEALTH INSURANCE | Attending: Internal Medicine | Primary: Internal Medicine

## 2020-11-30 DIAGNOSIS — I10 Essential (primary) hypertension: Secondary | ICD-10-CM

## 2020-11-30 MED ORDER — LISINOPRIL 10 MG TABLET
10 mg | ORAL_TABLET | 1 refills | Status: AC
Start: 2020-11-30 — End: 2021-02-22

## 2020-11-30 NOTE — Telephone Encounter
erxd

## 2021-01-09 ENCOUNTER — Encounter: Admit: 2021-01-09 | Payer: PRIVATE HEALTH INSURANCE | Attending: Hematology | Primary: Internal Medicine

## 2021-01-09 DIAGNOSIS — I2699 Other pulmonary embolism without acute cor pulmonale: Secondary | ICD-10-CM

## 2021-01-12 MED ORDER — RIVAROXABAN 20 MG TABLET
20 mg | ORAL_TABLET | Freq: Every day | ORAL | 5 refills | Status: AC
Start: 2021-01-12 — End: ?

## 2021-02-08 ENCOUNTER — Encounter: Admit: 2021-02-08 | Payer: PRIVATE HEALTH INSURANCE | Attending: Internal Medicine | Primary: Internal Medicine

## 2021-02-08 DIAGNOSIS — R198 Other specified symptoms and signs involving the digestive system and abdomen: Secondary | ICD-10-CM

## 2021-02-08 DIAGNOSIS — K5900 Constipation, unspecified: Secondary | ICD-10-CM

## 2021-02-08 DIAGNOSIS — Z1211 Encounter for screening for malignant neoplasm of colon: Secondary | ICD-10-CM

## 2021-02-08 DIAGNOSIS — R1032 Left lower quadrant pain: Secondary | ICD-10-CM

## 2021-02-09 ENCOUNTER — Telehealth: Admit: 2021-02-09 | Payer: PRIVATE HEALTH INSURANCE | Primary: Internal Medicine

## 2021-02-09 ENCOUNTER — Encounter: Admit: 2021-02-09 | Payer: PRIVATE HEALTH INSURANCE | Attending: Internal Medicine | Primary: Internal Medicine

## 2021-02-09 NOTE — Telephone Encounter
Pt called in to schedule a colonoscopy

## 2021-02-12 ENCOUNTER — Telehealth: Admit: 2021-02-12 | Payer: PRIVATE HEALTH INSURANCE | Attending: Internal Medicine | Primary: Internal Medicine

## 2021-02-12 NOTE — Telephone Encounter
Pt returning Diana's call per referral notes. Based on Notes, pt was offered first available amongst providers Edmund Hilda and Radiographer, therapeutic. First available found in March. 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 He asked for a clinical member to call him back to speak directly with him otherwise, he'd seek assistance from outside of [place].Pt's best call back number is [phone]

## 2021-02-14 ENCOUNTER — Telehealth: Admit: 2021-02-14 | Payer: PRIVATE HEALTH INSURANCE | Primary: Internal Medicine

## 2021-02-14 NOTE — Telephone Encounter
Called patient left message to schedule a next available pre-colon with With Shanda Bumps

## 2021-02-22 ENCOUNTER — Ambulatory Visit: Admit: 2021-02-22 | Payer: BLUE CROSS/BLUE SHIELD | Attending: Internal Medicine | Primary: Internal Medicine

## 2021-02-22 ENCOUNTER — Encounter: Admit: 2021-02-22 | Payer: PRIVATE HEALTH INSURANCE | Attending: Internal Medicine | Primary: Internal Medicine

## 2021-02-22 DIAGNOSIS — I7300 Raynaud's syndrome without gangrene: Secondary | ICD-10-CM

## 2021-02-22 DIAGNOSIS — D6861 Antiphospholipid syndrome: Secondary | ICD-10-CM

## 2021-02-22 DIAGNOSIS — I2699 Other pulmonary embolism without acute cor pulmonale: Secondary | ICD-10-CM

## 2021-02-22 DIAGNOSIS — M109 Gout, unspecified: Secondary | ICD-10-CM

## 2021-02-22 DIAGNOSIS — Z0000 Encounter for general adult medical examination without abnormal findings: Secondary | ICD-10-CM

## 2021-02-22 DIAGNOSIS — K5900 Constipation, unspecified: Secondary | ICD-10-CM

## 2021-02-22 DIAGNOSIS — F1020 Alcohol dependence, uncomplicated: Secondary | ICD-10-CM

## 2021-02-22 DIAGNOSIS — I10 Essential (primary) hypertension: Secondary | ICD-10-CM

## 2021-02-22 DIAGNOSIS — R109 Unspecified abdominal pain: Secondary | ICD-10-CM

## 2021-02-22 DIAGNOSIS — E559 Vitamin D deficiency, unspecified: Secondary | ICD-10-CM

## 2021-02-22 NOTE — Progress Notes
Name:Dalton McLaughlinDOB:  1980-04-24 MRN:  [id number] History of Present Illness:Dalton Jones had concerns including Annual Exam.Constipation and abd pin - histircally never had problems. Went 4-5 days without BM, added fiber then added miralax. Stopped miralax, kept fiber going BID, then constipation recurred. Having intermitttent abd pain in left of abd when not having BM. No diarrhea, no bleeding, no n/v. Feels fine otherwise, appetite OK; maybe gained few lbs over holidays.. . Medical History:He  has a past medical history of Alcoholism (HC Code) (HC CODE) (HC Code), Antiphospholipid antibody syndrome (HC Code) (HC CODE) (HC Code), Bilateral pulmonary embolism (HC Code) (HC CODE) (HC Code) (jan 2012), Gout, Hypertension, Raynaud phenomenon (02/18/2020), and Vitamin D deficiency (02/18/2020).Allergies:He has No Known Allergies.Medications:He has a current medication list which includes the following prescription(s): amLODIPine (NORVASC) 10 mg tablet - TAKE 1 TABLET(10 MG) BY MOUTH DAILY, rivaroxaban (XARELTO) 20 mg tablet - Take 1 tablet (20 mg total) by mouth Daily @1700 , triamterene-hydroCHLOROthiazide (MAXZIDE-25) 37.5-25 mg per tablet - TAKE 1 TABLET BY MOUTH DAILY, and lisinopriL (PRINIVIL,ZESTRIL) 10 mg tablet - TAKE 1 TABLET(10 MG) BY MOUTH DAILY.Social History:He  reports that he has never smoked. He has never used smokeless tobacco. He reports that he does not drink alcohol and does not use drugs.Exercise - not sedentary.walk a lot/ Review of SystemsNo cardiac or pulmonary symptoms. GI - see aboveGU- negM-S - negPhysical Examination: BP 121/74 (Site: l a)  - Pulse 68  - Temp 98.6 ?F (37 ?C) (Temporal)  - Ht 6' (1.829 m)  - Wt 84.6 kg  - SpO2 98%  - BMI 25.31 kg/m? General Appearance:  Alert, no distress Head:  Normocephalic, atraumatic Neck: Supple, no adenopathy Lungs:   Clear to auscultation bilaterally Heart:  Regular rate and rhythm, normal S1/S2, no murmurs Abdomen:   Soft, tenderness left lateral abd; normal bowel sounds, no masses Extremities: No edema   Assessment and Plan:1. Left sided abd pain and constipation/alteration in bowel habits. Obtain labs below. [place] A/P ordered. Has appt with GI to set up C-scope.2. HTN  Good control on current meds. Labs below3. H/o PE with Antiphospolipid ABs. Managed by Hematology on DOAC.Orders Placed This Encounter Procedures ? [place] Abdomen Pelvis w IV Contrast ? Lipid panel ? Hemoglobin A1c ? TSH w/reflex to FT4     (BH GH LMW Q YH) ? CBC and differential ? Comprehensive metabolic panel Electronically signed by Venia Minks, MD

## 2021-02-24 ENCOUNTER — Inpatient Hospital Stay: Admit: 2021-02-24 | Discharge: 2021-02-24 | Payer: BLUE CROSS/BLUE SHIELD | Primary: Internal Medicine

## 2021-02-24 DIAGNOSIS — Z0000 Encounter for general adult medical examination without abnormal findings: Secondary | ICD-10-CM

## 2021-02-24 DIAGNOSIS — I2699 Other pulmonary embolism without acute cor pulmonale: Secondary | ICD-10-CM

## 2021-02-24 DIAGNOSIS — K5900 Constipation, unspecified: Secondary | ICD-10-CM

## 2021-02-24 DIAGNOSIS — R109 Unspecified abdominal pain: Secondary | ICD-10-CM

## 2021-02-24 LAB — CBC WITH AUTO DIFFERENTIAL
BKR PROTEIN TOTAL: 38.8 % (ref 17.0–50.0)
BKR WAM ABSOLUTE IMMATURE GRANULOCYTES.: 0 x 1000/??L (ref 0.00–0.30)
BKR WAM ABSOLUTE LYMPHOCYTE COUNT.: 1.96 x 1000/??L (ref 0.60–3.70)
BKR WAM ABSOLUTE NRBC (2 DEC): 0 x 1000/??L (ref 0.00–1.00)
BKR WAM ANALYZER ANC: 2.52 x 1000/??L (ref 2.00–7.60)
BKR WAM BASOPHIL ABSOLUTE COUNT.: 0.03 x 1000/??L (ref 0.00–1.00)
BKR WAM BASOPHILS: 0.6 % (ref 0.0–1.4)
BKR WAM EOSINOPHIL ABSOLUTE COUNT.: 0.23 x 1000/??L (ref 0.00–1.00)
BKR WAM EOSINOPHILS: 4.6 % (ref 0.0–5.0)
BKR WAM HEMATOCRIT (2 DEC): 43.2 % (ref 38.50–50.00)
BKR WAM HEMOGLOBIN: 15.1 g/dL (ref 13.2–17.1)
BKR WAM IMMATURE GRANULOCYTES: 0 % (ref 0.0–1.0)
BKR WAM LYMPHOCYTES: 38.8 % (ref 17.0–50.0)
BKR WAM MCH (PG): 32.6 pg (ref 27.0–33.0)
BKR WAM MCHC: 35 g/dL — ABNORMAL HIGH (ref 31.0–36.0)
BKR WAM MCV: 93.3 fL (ref 80.0–100.0)
BKR WAM MONOCYTE ABSOLUTE COUNT.: 0.31 x 1000/??L (ref 0.00–1.00)
BKR WAM MONOCYTES: 6.1 % (ref 4.0–12.0)
BKR WAM MPV: 11.4 fL (ref 8.0–12.0)
BKR WAM NEUTROPHILS: 49.9 % (ref 39.0–72.0)
BKR WAM NUCLEATED RED BLOOD CELLS: 0 % (ref 0.0–1.0)
BKR WAM PLATELETS: 235 x1000/??L (ref 150–420)
BKR WAM RDW-CV: 11.9 % (ref 11.0–15.0)
BKR WAM RED BLOOD CELL COUNT.: 4.63 M/??L (ref 4.00–6.00)
BKR WAM WHITE BLOOD CELL COUNT: 5.1 x1000/??L (ref 0.00–1.00)

## 2021-02-24 LAB — LIPID PANEL
BKR A/G RATIO: 64 mg/dL (ref 1.0–2.2)
BKR CHOLESTEROL/HDL RATIO: 2.3 (ref 0.0–5.0)
BKR CHOLESTEROL: 146 mg/dL
BKR HDL CHOLESTEROL: 64 mg/dL (ref >=40)
BKR LDL CHOLESTEROL SAMPSON CALCULATED: 64 mg/dL (ref 0.00–0.30)

## 2021-02-24 LAB — COMPREHENSIVE METABOLIC PANEL
BKR A/G RATIO: 2.1 (ref 1.0–2.2)
BKR ALANINE AMINOTRANSFERASE (ALT): 16 U/L (ref 9–59)
BKR ALBUMIN: 4.8 g/dL (ref 3.6–4.9)
BKR ALKALINE PHOSPHATASE: 57 U/L (ref 9–122)
BKR ANION GAP: 9 pg (ref 7–17)
BKR ASPARTATE AMINOTRANSFERASE (AST): 22 U/L (ref 10–35)
BKR AST/ALT RATIO: 1.4
BKR BILIRUBIN TOTAL: 0.8 mg/dL (ref <=1.2)
BKR BLOOD UREA NITROGEN: 16 mg/dL (ref 6–20)
BKR BUN / CREAT RATIO: 13.3 (ref 8.0–23.0)
BKR CALCIUM: 10 mg/dL (ref 8.8–10.2)
BKR CHLORIDE: 100 mmol/L (ref 98–107)
BKR CO2: 31 mmol/L — ABNORMAL HIGH (ref 20–30)
BKR CREATININE: 1.2 mg/dL (ref 0.40–1.30)
BKR EGFR, CREATININE (CKD-EPI 2021): >60 mL/min/{1.73_m2} (ref >=60)
BKR GLOBULIN: 2.3 g/dL (ref 2.3–3.5)
BKR GLUCOSE: 107 mg/dL — ABNORMAL HIGH (ref 70–100)
BKR POTASSIUM: 3.5 mmol/L (ref 3.3–5.3)
BKR SODIUM: 140 mmol/L (ref 136–144)

## 2021-02-24 LAB — TSH W/REFLEX TO FT4     (BH GH LMW Q YH): BKR THYROID STIMULATING HORMONE: 2.19 [IU]/mL

## 2021-02-25 LAB — HEMOGLOBIN A1C
BKR ESTIMATED AVERAGE GLUCOSE: 100 mg/dL (ref 0.00–1.00)
BKR HEMOGLOBIN A1C: 5.1 % (ref 4.0–5.6)
BKR TRIGLYCERIDES: 5.1 % (ref 4.0–5.6)

## 2021-02-26 ENCOUNTER — Encounter: Admit: 2021-02-26 | Payer: PRIVATE HEALTH INSURANCE | Primary: Internal Medicine

## 2021-02-27 ENCOUNTER — Inpatient Hospital Stay: Admit: 2021-02-27 | Discharge: 2021-02-27 | Payer: BLUE CROSS/BLUE SHIELD | Primary: Internal Medicine

## 2021-02-27 DIAGNOSIS — R109 Unspecified abdominal pain: Secondary | ICD-10-CM

## 2021-02-27 MED ORDER — SODIUM CHLORIDE 0.9 % LARGE VOLUME SYRINGE FOR AUTOINJECTOR
Freq: Once | INTRAVENOUS | Status: CP | PRN
Start: 2021-02-27 — End: ?
  Administered 2021-02-27: 23:00:00 via INTRAVENOUS

## 2021-02-27 MED ORDER — IOHEXOL 350 MG IODINE/ML INTRAVENOUS SOLUTION
350 mg iodine/mL | Freq: Once | INTRAVENOUS | Status: CP | PRN
Start: 2021-02-27 — End: ?
  Administered 2021-02-27: 23:00:00 350 mL via INTRAVENOUS

## 2021-02-28 ENCOUNTER — Encounter: Admit: 2021-02-28 | Payer: PRIVATE HEALTH INSURANCE | Primary: Internal Medicine

## 2021-02-28 ENCOUNTER — Encounter: Admit: 2021-02-28 | Payer: BLUE CROSS/BLUE SHIELD | Primary: Internal Medicine

## 2021-02-28 ENCOUNTER — Encounter: Admit: 2021-02-28 | Payer: PRIVATE HEALTH INSURANCE | Attending: Internal Medicine | Primary: Internal Medicine

## 2021-02-28 DIAGNOSIS — I7300 Raynaud's syndrome without gangrene: Secondary | ICD-10-CM

## 2021-02-28 DIAGNOSIS — K5900 Constipation, unspecified: Secondary | ICD-10-CM

## 2021-02-28 DIAGNOSIS — I10 Essential (primary) hypertension: Secondary | ICD-10-CM

## 2021-02-28 DIAGNOSIS — E559 Vitamin D deficiency, unspecified: Secondary | ICD-10-CM

## 2021-02-28 DIAGNOSIS — F1020 Alcohol dependence, uncomplicated: Secondary | ICD-10-CM

## 2021-02-28 DIAGNOSIS — R1032 Left lower quadrant pain: Secondary | ICD-10-CM

## 2021-02-28 DIAGNOSIS — I2699 Other pulmonary embolism without acute cor pulmonale: Secondary | ICD-10-CM

## 2021-02-28 DIAGNOSIS — M109 Gout, unspecified: Secondary | ICD-10-CM

## 2021-02-28 DIAGNOSIS — D6861 Antiphospholipid syndrome: Secondary | ICD-10-CM

## 2021-02-28 MED ORDER — AMLODIPINE 10 MG TABLET
10 mg | ORAL_TABLET | 4 refills | Status: AC
Start: 2021-02-28 — End: ?

## 2021-02-28 NOTE — Progress Notes
[facility] GastroenterologyRosemarie Sherrie Mustache, MD Dalton Rote, MD Dalton Cancer, MD Dalton Jones, APP Dalton Nanny, MD Dalton Lineman, MD Dalton Henderson, MD Dalton Jones, APP Dalton Scale, MD [name], MD [name], APP  Reason for referral: Pre-colon HPI:41 year old male with H/o PE with Antiphospolipid Abs, managed by Hematology on DOAC (Xarelto) with new onset constipation and LLQ discomfort with a sense of fullness in this region x several weeks.  He does not typically have GI symptoms.  Notes a high fiber diet and adding more fiber did not help. He has to take Miralax 1-2 times daily to have a bowel movement. There is no melena or hematochezia.Discomfort in LLQ when he does not take the laxative. Denies fevers, chills, N/V/D, blood in stool. His weight and appetite remain stables. Denies family history of colon Jones. ZOX:[id number]: Negative.HENT: Negative. Eyes: Negative. Respiratory: Negative for cough and shortness of breath. Cardiovascular: Negative for chest pain. Neurological: Negative for dizziness. Psychiatric/Behavioral: Negative for agitation.All other systems reviewed and are negative.PMHx:Past Medical History: Diagnosis Date ? Alcoholism (HC Code) (HC CODE) (HC Code)  ? Antiphospholipid antibody syndrome (HC Code) (HC CODE) (HC Code)  ? Bilateral pulmonary embolism (HC Code) (HC CODE) (HC Code) jan 2012  unprovoked pulmonary embolus ? Gout  ? Hypertension  ? Raynaud phenomenon 02/18/2020 ? Vitamin D deficiency 02/18/2020 PSHx:Past Surgical History: Procedure Laterality Date ? bone marrow donation   Social YN:WGNFAO History Socioeconomic History ? Marital status: Married   Spouse name: Not on file ? Number of children: Not on file ? Years of education: Not on file ? Highest education level: Not on file Occupational History ? Not on file Tobacco Use ? Smoking status: Never ? Smokeless tobacco: Never Substance and Sexual Activity ? Alcohol use: No   Comment: abstinent 02.12.2013  ? Drug use: No ? Sexual activity: Not on file Other Topics Concern ? Not on file Social History Narrative ? Not on file Social Determinants of Health Financial Resource Strain: Not on file Food Insecurity: Not on file Transportation Needs: Not on file Physical Activity: Not on file Stress: Not on file Social Connections: Not on file Intimate Partner Violence: Not on file Housing Stability: Not on file Family ZH:[id number] History Problem Relation Age of Onset ? Hypertension Mother  ? Jones Father  ? Gout Brother  ? Alcohol abuse Brother       now sober ? Other (data conversion) Brother       optic neuritis ? Rheum arthritis Brother  ? Jones Maternal Grandmother  ? Diabetes Maternal Grandmother  ? Hypertension Maternal Grandmother  ? Thyroid disease Maternal Grandmother  ? Hypertension Maternal Grandfather  ? Alcohol abuse Maternal Grandfather  ? Diabetes Paternal Grandmother  ? Cataracts Paternal Grandmother  ? Hypertension Paternal Grandmother  ? Alcohol abuse Paternal Grandmother  ? Jones Paternal Grandfather  ? Hypertension Paternal Grandfather  Medications:Current Outpatient Medications on File Prior to Visit Medication Sig Dispense Refill ? amLODIPine (NORVASC) 10 mg tablet TAKE 1 TABLET(10 MG) BY MOUTH DAILY 90 tablet 3 ? rivaroxaban (XARELTO) 20 mg tablet Take 1 tablet (20 mg total) by mouth Daily @1700 . 90 tablet 4 ? triamterene-hydroCHLOROthiazide (MAXZIDE-25) 37.5-25 mg per tablet TAKE 1 TABLET BY MOUTH DAILY 90 tablet 3 No current facility-administered medications on file prior to visit. Allergies:No Known AllergiesPhysical Exam:Vitals: There were no vitals filed for this visit.General Appearance: Well appearing in NADNeuro: Alert and oriented x 3Labs: 02/24/21 labs reviewed; unremarkable. Imaging:02/22/21 CTAP: [place] ABDOMEN PELVIS W IV CONTRASTHISTORY: Left-sided abdominal pain and constipationTECHNIQUE:  A [place] scan of the abdomen and pelvis was performed from the domes of the diaphragm to the symphysis pubis. 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  He does not typically have GI symptoms.  #Abdominal discomfort #Constipation; given new onset symptoms with no relief from high fiber diet and despite improvement with laxatives, reasonable to proceed with C-scope to assess. If he stops the laxative, symptoms return and he is uncomfortable. He has a  diagnosis of unprovoked bilateral pulmonary embolus in January of 2012. 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 Patient is now on Xarelto which will need to be held for 48 hours prior to colonoscopy. - Should address A/C management with Hematologist given history-- 03/13/21 apt with hematologist. I will reach out as well for recommendations. - Discussed risks and benefits of a colonoscopy procedure including but not limited to infection, bleeding and perforation requiring surgical correction. Discussed prep protocol and the importance of compliance with colonoscopy prep in order to have adequate clean-out for procedure. Patient understands and agrees with plan, opted to proceed. VIDEO TELEHEALTH VISIT: This clinician is part of the telehealth program and is conducting this visit in a currently approved location. For this visit the clinician and patient were present via interactive audio & video telecommunications system that permits real-time communications, via the [employer].Patient's use of the telehealth platform followed consent and acknowledges agreement to permit telehealth for this visit. State patient is located in: [place] clinician is appropriately licensed in the above state to provide care for this visit. Other individuals present during the telehealth encounter and their role/relation: noneIf billing based on time, please complete (Not required if billing based on MDM):                           Total time spent in medical video consultation: 30 minutes; Total time spent by the provider on the day of service, which includes time spent on chart review, medical video consultation, patient education, coordination of care/services and counseling Because this visit was completed over video, a hands-on physical exam was not performed.  Patient/parent or guardian understands and knows to call back if condition changes.[name], PA-C Physician AssistantDepartment of Digestive [facility] of Medicine Electronically Signed by Erskin Burnet, February 28, 2021

## 2021-02-28 NOTE — Telephone Encounter
erxd

## 2021-03-01 ENCOUNTER — Telehealth: Admit: 2021-03-01 | Payer: PRIVATE HEALTH INSURANCE | Primary: Internal Medicine

## 2021-03-01 ENCOUNTER — Encounter: Admit: 2021-03-01 | Payer: PRIVATE HEALTH INSURANCE | Primary: Internal Medicine

## 2021-03-01 NOTE — Telephone Encounter
-----   Message from Bubba Camp, [place] sent at 03/01/2021  9:49 AM EST -----Regarding: FW: Need OK to hold A/COK to schedule. Got Xarelto clearance/instructions. I put in referral. Thanks ----- Message -----From: [name], MDSent: 03/01/2021   9:42 AM ESTTo: Bubba Camp, PA-CSubject: RE: Need OK to hold A/C                      Should be fine- Cr normal ----- Message -----From: Bubba Camp, PA-CSent: 03/01/2021   9:16 AM ESTTo: [name], MDSubject: FW: Need OK to hold A/C                      Saw this patient in clinic. I reached out to hematologist regarding Xarelto (history of PE)--  Is it ok for them to hold xarelto 24 instead of 48 hours for colon? Just want to make sure following hospital protocol and avoid cancellation. ----- Message -----From: Venia Minks, MDSent: 02/28/2021   5:29 PM ESTTo: Bubba Camp, PA-CSubject: RE: Need OK to hold A/C                      Yes but I think 24 hours is acceptable if low-mod risk of bleeding? ----- Message -----From: Bubba Camp, PA-CSent: 02/28/2021   4:16 PM ESTTo: Venia Minks, MDSubject: Need OK to hold A/C                          Hello, My name is [name] and I am a PA with [facility]. We share the above patient who will be having a colonoscopy with our office. I was hoping to get the OK to hold his Xarelto for 48 hours prior to this procedure. Thank you so much, [name], PA-C Physician AssistantDepartment of Digestive [facility] of Medicine

## 2021-03-01 NOTE — Telephone Encounter
Left pt VM to contact our office to schedule appt (COLON, No [place], hold Xarelto x 24 hours) - 1st attempt

## 2021-03-02 ENCOUNTER — Encounter: Admit: 2021-03-02 | Payer: PRIVATE HEALTH INSURANCE | Primary: Internal Medicine

## 2021-03-02 NOTE — Telephone Encounter
Returned pt's call and left a vm for pt to contact our office to sch his procedureI will sent him a message via my chart

## 2021-03-02 NOTE — Telephone Encounter
Patient returning call to schedule his colonoscopy. Tired warm transfer. 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 Best call back is [phone]

## 2021-03-13 ENCOUNTER — Telehealth: Admit: 2021-03-13 | Payer: PRIVATE HEALTH INSURANCE | Primary: Internal Medicine

## 2021-03-13 ENCOUNTER — Encounter: Admit: 2021-03-13 | Payer: PRIVATE HEALTH INSURANCE | Primary: Internal Medicine

## 2021-03-13 ENCOUNTER — Ambulatory Visit: Admit: 2021-03-13 | Payer: BLUE CROSS/BLUE SHIELD | Attending: Hematology and Oncology | Primary: Internal Medicine

## 2021-03-13 DIAGNOSIS — I2699 Other pulmonary embolism without acute cor pulmonale: Secondary | ICD-10-CM

## 2021-03-13 NOTE — Telephone Encounter
Dr. Carmon Ginsberg appt has been scheduled w/Dr. Valorie Roosevelt in [facility] on 04/11/2021.  Prep was reviewed and understood by pt and sent to pt via mychart. Pt understands hospital will call w/arrival time. Pt understands he will need someone to accompany him home.  Pharmacy was verified -

## 2021-03-18 NOTE — Progress Notes
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  He complained on SOB and horrible pain in the left side of his chest and back.  He noted dizziness and tachycardia with ambulation.  CTA showed bilateral lower lobe pulmonary emboli with bilateral lower lobe peripheral opacities suggestive of infarcts.  Incompletely visualized, prominent appearing spleen was noted.  ?Bilateral lower extremity US was negative for DVT and abdominal US showed splenomegaly with the spleen measuring 15 cm in length.  There was no evidence of portal vein thrombosis.  TTE was a technically limited study.  Joe had normal left ventricular size.  Normal wall thickness.  Normal systolic function.  Estimated ejection fraction was 59%.  Normal diastolic function.  Poor visualization of the right ventricle, difficult to determine size and function.  No significant valvular disease.  No significant pericardial effusion.?He had no obvious risk factors for PE:  no surgeries or immobilization, no known hypercoagulable disorders, though he does have an uncle who had two episodes of unprovoked PE.  The patient was worked up for hypercoagulable state and had normal protein C, protein S, and antithrombin activity.  The workup returned back positive for lupus anticoagulant (phospholipid neutralization as well as prolonged dilute Russell viper venom time of 1.27 (nl < 1.21)).  The patient also had elevated anticardiolipin antibody with IgM antibody measuring at 138 MPL.  Beta-2 glycoprotein-1 IgM antibody was elevated at 52 MPL.  The patient was also found to have heterozygosity for factor V Leiden mutation and did not have prothrombin gene mutation.  Interestingly, APC resistance was WNL.  His homocystine level was within normal limits.  ANA was negative, RF was 32 and JAK-2 V617F mutation was negative. Repeat testing for APLs including B2GP-1 Ab, ACL Ab and LA was negative in 5/12 and 11/12. ?Treatment with intravenous heparin followed by subcutaneous Fragmin and Coumadin.  He was evaluated by Pulmonary Service for possible TPA, but was found not to have right ventricular stain by transthoracic echocardiogram done in the emergency department.  Cardiac enzymes were within normal limits.  He was discharged from the hospital on Fragmin and Coumadin on 02/24/2010.?He does have frequent gout episodes; previously, they were once a year, but over the five week period since January of 2012, he experienced four episodes.  In the past, he was taking ibuprofen as well as steroids and colchicine for this issue.  He was evaluated by a rheumatologist in [place] a while ago.  He never had arthrocentesis to confirm the diagnosis, but responded appropriately to above mentioned treatment and also is known to have elevated uric acid, in the past it was up to 16.?Patient was seen by Dr. Feliberto Gottron in pulmonary clinic in 7/12 and was discharge from the clinic as he is at low risk for the development of chronic thromboembolic pulmonary hypertension or chronic cardiopulmonary complications from his pulmonary embolism.?He was admitted to the [facility] ICU in 2/13 with alcohol withdrawal requiring benzodiazepine drip.  After admission he went to [facility] facility in [place] where he stayed until 4/13.  He returned back to work on 5/13. Presently, he is completely abstinent.  He is in a medically-monitored program with random urine testing and ongoing support groups including AA and a physician's group. There is a very strong family history of alcoholism in multiple family members.   Patient was seen by nephrologist in 5/21 - stable Cr of 1.2 with likely reason for elevation being HTN. ?INTERIM HISTORY:  The patient presents for a yealry followup visit without any complaints. He was last seen on 03/14/20. No complains. He is on 2 meds for BP with better control. Lisinopril was discontinued a while ago.?REVIEW OF ORGANS AND SYSTEMS:  No fevers fevers, sweats or weight loss. No bleeding. Otherwise, unremarkable.?MEDICATIONS:  Current Outpatient Medications on File Prior to Visit Medication Sig Dispense Refill ? amLODIPine (NORVASC) 10 mg tablet TAKE 1 TABLET(10 MG) BY MOUTH DAILY 90 tablet 3 ? rivaroxaban (XARELTO) 20 mg tablet Take 1 tablet (20 mg total) by mouth Daily @1700 . 90 tablet 4 ? triamterene-hydroCHLOROthiazide (MAXZIDE-25) 37.5-25 mg per tablet TAKE 1 TABLET BY MOUTH DAILY 90 tablet 3 No current facility-administered medications on file prior to visit. PHYSICAL EXAMINATION:  This is a 41 year old male in no acute distress.  BP 114/71  - Pulse (!) 59  - Temp 97.8 ?F (36.6 ?C) (Oral)  - Resp 15  - Ht 6' (1.829 m)  - Wt 84.5 kg  - SpO2 97%  - BMI 25.27 kg/m? Neck:  Supple without JVD or lymphadenopathy.  Cardiovascular Exam: Regular rate and rhythm.  No murmurs.  Lungs are clear to auscultation.  Abdomen is nontender and nondistended with positive bowel sounds without palpable organomegaly.  Extremities:  Warm and well perfused.  No edema.  Neurological Exam:  Alert and oriented x3.  Overall, nonfocal exam.  Skin is without rashes.  Lymph node survey is unrevealing.ECOG PS is 0?LABS: Latest Reference Range & Units 02/24/21 08:36 Sodium 136 - 144 mmol/L 140 Potassium 3.3 - 5.3 mmol/L 3.5 Chloride 98 - 107 mmol/L 100 CO2 20 - 30 mmol/L 31 (H) Anion Gap 7 - 17  9 BUN 6 - 20 mg/dL 16 Creatinine 1.61 - 0.96 mg/dL 0.45 BUN/Creatinine Ratio 8.0 - 23.0  13.3 eGFR (Creatinine) >=60 mL/min/1.53m2 >60 Glucose 70 - 100 mg/dL 409 (H) Calcium 8.8 - 81.1 mg/dL 91.4 Total Bilirubin <=7.8 mg/dL 0.8 Alkaline Phosphatase 9 - 122 U/L 57 Alanine Aminotransferase (ALT) 9 - 59 U/L 16 Aspartate Aminotransferase (AST) 10 - 35 U/L 22 AST/ALT Ratio See Comment  1.4 Total Protein 6.6 - 8.7 g/dL 7.1 Albumin 3.6 - 4.9 g/dL 4.8 Globulin 2.3 - 3.5 g/dL 2.3 A/G Ratio 1.0 - 2.2  2.1 Cholesterol See Comment mg/dL 295 HDL Cholesterol >=62 mg/dL 64 Triglycerides See Comment mg/dL 130 LDL Calculated See Comment mg/dL 64 Chol/HDL Ratio 0.0 - 5.0  2.3 Thyroid Stimulating Hormone, 3rd Gen. See Comment ?IU/mL 2.190 Estimated Average Glucose mg/dL 865 Hemoglobin H8I 4.0 - 5.6 % 5.1 WBC 4.0 - 11.0 x1000/?L 5.1 RBC 4.00 - 6.00 M/?L 4.63 Hemoglobin 13.2 - 17.1 g/dL 69.6 Hematocrit 29.52 - 50.00 % 43.20 MCV 80.0 - 100.0 fL 93.3 MCH 27.0 - 33.0 pg 32.6 MCHC 31.0 - 36.0 g/dL 84.1 RDW-CV 32.4 - 40.1 % 11.9 Platelets 150 - 420 x1000/?L 235 MPV 8.0 - 12.0 fL 11.4 Neutrophils 39.0 - 72.0 % 49.9 Lymphocytes 17.0 - 50.0 % 38.8 Monocytes 4.0 - 12.0 % 6.1 Eosinophils 0.0 - 5.0 % 4.6 Basophils 0.0 - 1.4 % 0.6 Immature Granulocytes 0.0 - 1.0 % 0.0  nRBC 0.0 - 1.0 % 0.0 ANC (Abs Neutrophil Count) 2.00 - 7.60 x 1000/?L 2.52 Absolute Lymphocyte Count 0.60 - 3.70 x 1000/?L 1.96 Monocytes (Absolute) 0.00 - 1.00 x 1000/?L 0.31 Eosinophil Absolute Count 0.00 - 1.00 x 1000/?L 0.23 Basophils Absolute 0.00 - 1.00 x 1000/?L 0.03 Immature Granulocytes (Abs) 0.00 - 0.30 x 1000/?L 0.00 nRBC Absolute 0.00 - 1.00 x 1000/?L 0.00 (H): Data is abnormally high6/11/13 PBS INTERPRETATION: Relative lymphocytosis with numerous atypical lymphocytes; while the lymphocytes appear mostly as activated/atypical forms, recommend flow cytometry studies to definitively evaluate lymphocyte clonality. Rare enlarged platelets. No spherocytes or schistocytes, hence no smear evidence for hemolysis.IMAGING2/7/23 Reed ABDOMEN PELVIS W IV CONTRAST?HISTORY: Left-sided abdominal pain and constipation?TECHNIQUE: A [place] scan of the abdomen and pelvis was performed from the domes of the diaphragm to the symphysis pubis. 80 cc of Omnipaque 350 were administered intravenously.?COMPARISON: No prior studies are available for comparison.?FINDINGS:Scattered sub-5 mm lung nodules for example in the posterior left lower lobe at image 18 series 4.?The liver, gallbladder, spleen, pancreas, kidneys, and adrenal glands are unremarkable, except for bilateral renal cysts and renal/hepatic hypodensities too small to characterize.?There is no bowel obstruction, ascites, or lymphadenopathy.?Avascular necrosis is noted in bilateral femoral heads.?IMPRESSION:The etiology of the patient's abdominal pain is not elucidated on this exam.?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  His testing reveal heterozygous factor V Leiden mutation and he was also transiently positive for anticardiolipin IgG antibody and beta-2 glycoprotein IgG antibody in high titers.  He also was positive for lupus anticoagulant.  Repeat APLs testing was negative in 5/12 and 11/12. ?Taking into consideration unprovoked nature of his VTE as well as presentation withy extensive pulmonary embolism, as well as his male gender, the decision was made to continue anticoagulation long-term. He was initially on Coumadin and was switched to rivaroxaban 20 mg daily with meals in 11/12. He tolerates the medication without problems and continues to take it daily. D-dimer tested on 07/02/11 and was WNL. He agrees to continue with anticoagulation after discussing the issue of duration of anticoagulation taking in to consideration unprovoked nature of the VTE, presentation as bilateral PE and male gender (higher risk of recurrence and potential for recurrence as PE). ?He was noticed to have macrocytosis in the past which was though to be related to alcohol. He remained slightly macrocytic until 12/12 even though he is not drinking alcohol since 2/13. B12 level was below 500, but RBC folate, methylmalonic acid and homocystine levels were WNL. PBS was only significant for mild macrocytosis.   He did have leukopenia and neutropenia in the past both resolved.  He does have mild splenomegaly, which was 15 cm in length by ultrasound performed on 12/22/2010. His Hct was 37% when tested on 06/24/11 but is normal since 12/12. His iron panel including ferritin were normal as well. CBC will be repeated after this visit. ?Due to HTN he remains on Norvasc 10 mg po daily, Maxzide  and is followed by Dr. [name]. His last Cr was elevated but stable at 1.2. His Cr clearance was > 60 when last tested in 5/21 and he continues with the current dose of Rivaroxiban. CMP testing will be repeated after this visit If Cr clearance is < 30 rivaroxiban has to be discontinued. We discussed the data supporting better outcomes among APLs patient with thrombosis treated with warfarin rather than DOACs but taking in to consideration  no thrombotic events since treatment initiation in 2012 we agreed to continue with current treatment. ?He will come for a followup visit in 12 months' time.Total time spent by the provider on the day of service, which includes time spent on chart review, in-person interaction with the patient, physical exam, education, coordination of care/services and counseling was 15 min. Maygan Koeller A. Cherlynn Perches, MD, PhD

## 2021-03-29 ENCOUNTER — Encounter: Admit: 2021-03-29 | Payer: PRIVATE HEALTH INSURANCE | Attending: Gastroenterology | Primary: Internal Medicine

## 2021-03-30 MED ORDER — PEG 3350-ELECTROLYTES 236 GRAM-22.74 GRAM-6.74 GRAM-5.86 GRAM SOLUTION
Freq: Once | ORAL | 1 refills | Status: AC
Start: 2021-03-30 — End: ?

## 2021-04-03 ENCOUNTER — Ambulatory Visit: Admit: 2021-04-03 | Payer: BLUE CROSS/BLUE SHIELD | Attending: Adult Health | Primary: Internal Medicine

## 2021-04-03 DIAGNOSIS — R1032 Left lower quadrant pain: Secondary | ICD-10-CM

## 2021-04-05 ENCOUNTER — Encounter: Admit: 2021-04-05 | Payer: PRIVATE HEALTH INSURANCE | Attending: Gastroenterology | Primary: Internal Medicine

## 2021-04-05 DIAGNOSIS — I7300 Raynaud's syndrome without gangrene: Secondary | ICD-10-CM

## 2021-04-05 DIAGNOSIS — F1020 Alcohol dependence, uncomplicated: Secondary | ICD-10-CM

## 2021-04-05 DIAGNOSIS — D6861 Antiphospholipid syndrome: Secondary | ICD-10-CM

## 2021-04-05 DIAGNOSIS — E559 Vitamin D deficiency, unspecified: Secondary | ICD-10-CM

## 2021-04-05 DIAGNOSIS — M109 Gout, unspecified: Secondary | ICD-10-CM

## 2021-04-05 DIAGNOSIS — I10 Essential (primary) hypertension: Secondary | ICD-10-CM

## 2021-04-05 DIAGNOSIS — I2699 Other pulmonary embolism without acute cor pulmonale: Secondary | ICD-10-CM

## 2021-04-05 NOTE — Telephone Encounter
First attempt call made to conduct pre-procedure call for Colonoscopy on 04/11/2021 at [facility]. No patient contact made. Left initial voice message to please return the call to the GI Navigation Department.

## 2021-04-05 NOTE — Telephone Encounter
Patient is scheduled for a colonoscopy on 04/11/2021 at [facility] endoscopy. Pre procedure call performed. Discharge transportation policy & NPO instructions given per pre procedure guidelines. Patient confirmed he has the laxative. Golytely prep instructions reviewed with patient. Patient instructed to take Amlodipine on the morning of scheduled procedure. Patient verbalized his understanding.Patient reports taking Xarelto. Per patient, he was instructed by PCP to stop taking Xarelto 24 hours prior to the procedure.

## 2021-04-06 NOTE — Other
ReviewReview NP: Vernell Morgans

## 2021-04-11 ENCOUNTER — Encounter: Admit: 2021-04-11 | Payer: BLUE CROSS/BLUE SHIELD | Primary: Internal Medicine

## 2021-04-11 ENCOUNTER — Inpatient Hospital Stay
Admit: 2021-04-11 | Discharge: 2021-04-11 | Payer: BLUE CROSS/BLUE SHIELD | Attending: Gastroenterology | Primary: Internal Medicine

## 2021-04-11 ENCOUNTER — Encounter: Admit: 2021-04-11 | Payer: PRIVATE HEALTH INSURANCE | Attending: Gastroenterology | Primary: Internal Medicine

## 2021-04-11 ENCOUNTER — Ambulatory Visit: Admit: 2021-04-11 | Payer: BLUE CROSS/BLUE SHIELD | Attending: Adult Health | Primary: Internal Medicine

## 2021-04-11 DIAGNOSIS — Z7901 Long term (current) use of anticoagulants: Secondary | ICD-10-CM

## 2021-04-11 DIAGNOSIS — K5730 Diverticulosis of large intestine without perforation or abscess without bleeding: Secondary | ICD-10-CM

## 2021-04-11 DIAGNOSIS — D123 Benign neoplasm of transverse colon: Secondary | ICD-10-CM

## 2021-04-11 DIAGNOSIS — Z79899 Other long term (current) drug therapy: Secondary | ICD-10-CM

## 2021-04-11 DIAGNOSIS — I10 Essential (primary) hypertension: Secondary | ICD-10-CM

## 2021-04-11 DIAGNOSIS — K5900 Constipation, unspecified: Secondary | ICD-10-CM

## 2021-04-11 DIAGNOSIS — M109 Gout, unspecified: Secondary | ICD-10-CM

## 2021-04-11 DIAGNOSIS — Z86711 Personal history of pulmonary embolism: Secondary | ICD-10-CM

## 2021-04-11 DIAGNOSIS — F1020 Alcohol dependence, uncomplicated: Secondary | ICD-10-CM

## 2021-04-11 DIAGNOSIS — Z1211 Encounter for screening for malignant neoplasm of colon: Secondary | ICD-10-CM

## 2021-04-11 DIAGNOSIS — K648 Other hemorrhoids: Secondary | ICD-10-CM

## 2021-04-11 DIAGNOSIS — I2699 Other pulmonary embolism without acute cor pulmonale: Secondary | ICD-10-CM

## 2021-04-11 DIAGNOSIS — D122 Benign neoplasm of ascending colon: Secondary | ICD-10-CM

## 2021-04-11 DIAGNOSIS — R1032 Left lower quadrant pain: Secondary | ICD-10-CM

## 2021-04-11 DIAGNOSIS — D6861 Antiphospholipid syndrome: Secondary | ICD-10-CM

## 2021-04-11 DIAGNOSIS — E559 Vitamin D deficiency, unspecified: Secondary | ICD-10-CM

## 2021-04-11 DIAGNOSIS — D127 Benign neoplasm of rectosigmoid junction: Secondary | ICD-10-CM

## 2021-04-11 DIAGNOSIS — I7300 Raynaud's syndrome without gangrene: Secondary | ICD-10-CM

## 2021-04-11 MED ORDER — LIDOCAINE (PF) 20 MG/ML (2 %) INJECTION SOLUTION
202 mg/mL (2 %) | INTRAVENOUS | Status: DC | PRN
Start: 2021-04-11 — End: 2021-04-11
  Administered 2021-04-11: 20:00:00 20 mg/mL (2 %) via INTRAVENOUS

## 2021-04-11 MED ORDER — PROPOFOL 10 MG/ML INTRAVENOUS EMULSION
10 mg/mL | INTRAVENOUS | Status: DC | PRN
Start: 2021-04-11 — End: 2021-04-11
  Administered 2021-04-11: 20:00:00 10 mL/h via INTRAVENOUS

## 2021-04-11 MED ORDER — PROPOFOL 10 MG/ML INTRAVENOUS EMULSION
10 mg/mL | INTRAVENOUS | Status: DC | PRN
Start: 2021-04-11 — End: 2021-04-11
  Administered 2021-04-11: 20:00:00 10 mg/mL via INTRAVENOUS

## 2021-04-11 MED ORDER — SODIUM CHLORIDE 0.9 % (FLUSH) INJECTION SYRINGE
0.9 % | Freq: Three times a day (TID) | INTRAVENOUS | Status: DC
Start: 2021-04-11 — End: 2021-04-12

## 2021-04-11 MED ORDER — LACTATED RINGERS INTRAVENOUS SOLUTION
INTRAVENOUS | Status: DC | PRN
Start: 2021-04-11 — End: 2021-04-11
  Administered 2021-04-11: 19:00:00 via INTRAVENOUS

## 2021-04-11 MED ORDER — ONDANSETRON HCL (PF) 4 MG/2 ML INJECTION SOLUTION
42 mg/2 mL | INTRAVENOUS | Status: DC | PRN
Start: 2021-04-11 — End: 2021-04-11
  Administered 2021-04-11: 20:00:00 4 mg/2 mL via INTRAVENOUS

## 2021-04-11 MED ORDER — SODIUM CHLORIDE 0.9 % (FLUSH) INJECTION SYRINGE
0.9 % | INTRAVENOUS | Status: DC | PRN
Start: 2021-04-11 — End: 2021-04-12

## 2021-04-11 MED ORDER — LACTATED RINGERS INTRAVENOUS SOLUTION
INTRAVENOUS | Status: DC
Start: 2021-04-11 — End: 2021-04-12
  Administered 2021-04-11: 18:00:00 1000.000 mL/h via INTRAVENOUS

## 2021-04-11 NOTE — Discharge Instructions
Patient Instructions after a ColonoscopyPhysician: Essie Christine, MDPhone Number: [phone] your procedure, it is typical to have the following:  A small amount of blood with your bowel movementModerate amounts of gas and mild abdominal cramping or bloating which will go away in a few hours. Walking around or putting a warm pack on your abdomen may help.  ActivityYou were given a medicine to help you go to sleep (anesthesia) for your colonoscopy. This may continue to affect you for 24 hours, so please do not: Drive or use heavy machinery.Sign important papers or documents.Drink alcohol.Return to your normal activities tomorrow. Take your medicines according to your doctor?s directions.Eating and DrinkingYou may start eating and drinking when you get home. Start slowly with smaller portions to make sure the food does not upset your stomach. Avoid heavy or fried foods that are hard to digest. Drink a lot of fluids.Call us If You Have: Persistent rectal bleeding or blood clots with your bowel movement.  Nausea or are vomiting.  A temperature over 100?F.Severe, increasing pain in your belly.  Provider writing instructions: Essie Christine, MD

## 2021-04-11 NOTE — Other
Operative Diagnosis:Pre-op:   * No pre-op diagnosis entered * Patient Coded Diagnosis   Pre-op diagnosis: Constipation, unspecified constipation type, Left lower quadrant abdominal pain  Post-op diagnosis: Constipation, unspecified constipation type, Left lower quadrant abdominal pain  Patient Diagnosis   None    Post-op diagnosis:   * Constipation, unspecified constipation type [K59.00]   * Left lower quadrant abdominal pain [R10.32]Operative Procedure(s) :Procedure(s) (LRB):COLONOSCOPY, FLEXIBLE; DX, W/WO SPECIMENS/COLON DECOMP (SEP PROC) (N/A)Post-op Procedure & Diagnosis ConfirmationPost-op Diagnosis: Post-op Diagnosis confirmed (no changes)Post-op Procedure: Post-op Procedure confirmed (no changes)Anesthesia ClarifiersGI/Endoscopy: Planned Screening Colonoscopy - Procedure Performed (see above)

## 2021-04-11 NOTE — Anesthesia Pre-Procedure Evaluation
This is a 41 y.o. male scheduled for COLONOSCOPY, FLEXIBLE; DX, W/WO SPECIMENS/COLON DECOMP (SEP PROC).Review of Systems/ Medical HistoryPatient summary, EKG/Cardiac Studies , Labs, pre-procedure vitals, height, weight and NPO status reviewed.Anesthesia Evaluation: Estimated body mass index is 23.75 kg/m? as calculated from the following:  Height as of this encounter: 6' 1 (1.854 m).  Weight as of this encounter: 81.6 kg. 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 Patient has no history of palpitations or syncope.  No dyspnea. -Exercise tolerance: >4 METS -Vascular Disease:  Raynaud phenomenon .-Other Cardiovascular: pulmonary embolus ( 2012).   Respiratory:      -Nicotine Dependence: no-Airway Infections: The patient had a no recent URI -Lung Disorders: Patient has pulmonary embolus ( 2012).  Endocrine/Metabolic: -Metabolic Disorders:  Patient has gout.-Comments: Antiphospholipid antibody syndrome Behavioral/Social/Psychiatric & Syndromes: Pt reports alcohol consumption.-Comments: Per 03/13/21 Office visit note per Dr Podoltsev:He was admitted to the [facility] ICU in 2/13 with alcohol withdrawal requiring benzodiazepine drip.  After admission he went to [place] facility in [place] where he stayed until 4/13.  He returned back to work on 5/13. Presently, he is completely abstinent.  He is in a medically-monitored program with random urine testing and ongoing support groups including AA and a physician's group. There is a very strong family history of alcoholism in multiple family membersAdditional Findings: 02/24/21 08:36Sodium: 140Potassium: 3.5Creatinine: 1.2000/05/13 08:36Hemoglobin: 15.1Hematocrit: 43.20Platelets: 235Physical ExamCardiovascular:    Rhythm: regularPulmonary:  normal exam  Airway:  Mallampati: IITM distance: >3 FBNeck ROM: fullMouth Opening: >3cmDental:  Dentition: caps and crownAnesthesia PlanASA 3 The primary anesthesia plan is  MAC. TIVA-MAC/GA with propofol, SASAMPerioperative Code Status confirmed: It is my understanding that the patient is currently designated as 'Full Code' and will remain so throughout the perioperative period.Anesthesia informed consent obtained. Consent obtained from: patientUse of blood products: consented    Consent Comment: Anesthetic plan discussed with the patient in detail. He/She understands the benefits & risks of anesthesia, and he/she consented to the planned anesthetic. Questions answered and all concerns addressedPlan discussed with CRNA.Anesthesiologist's Pre Op NoteI personally evaluated and examined the patient prior to the intra-operative phase of care on the day of the procedure.Marland Kitchen

## 2021-04-11 NOTE — H&P
ATTENDING ATTESTATION:I have performed a history and physical on the patient with Dr. Ferne Coe. I have participated in the discussion and management of the patient. I have read the note above, reviewed the pertinent labs, radiology, endoscopy reports and pathology, reviewed and updated Quality Measures, and agree with the evaluation and management as outlined above by Dr. Ferne Coe, except for the additional findings as noted:  Colonoscopy - discussed with the patient who understands and agrees to the procedure and is a candidate for IV sedation / anesthesia. ASA 2.History - 41 year old man with prior PE, antiphospholipid syndrome on xarelto, presenting for colonoscopy in setting of change in bowel habits x 3 months. This is his first colonoscopy.He states he started to develop constipation and LLQ pain out of the blue in December 2022. He was prescribed miralax which he must take daily, in order to have regular BMs. The abdominal pain has improved with miralax. If he does not take miralax he does not have a BM for anywhere between 3 and 5 days. Prior to this he did not need medication to have a BM.  [place] scan 02/27/2021- without abnormality to explain abdominal pain. No bowel obstruction, lymphadenopathy, ascites, or diverticulitis/diverticulosis, masses seen.Past Medical History - PE, antiphospholipid syndrome on xareltoFamily History - Many family members died young from cardiac issues, cancers but no colon cancer in the familySocial History - Non-smokerAlcohol use - rarely No illicit drugsMedications - Xarelto- last dose on 3/20 in AMSee list.ROS - No skin rashesNo joint painsNo chest pain or shortness of breath.The remainder of the 14 point review of systems was normal or negative, except for what is mentioned in the history above.PE: alert in NADEyes - non-icteric sclera, normal conjunctivaMouth - mucus membranes moist, no ulcers seenCVS - RRRLungs - Clear to auscultation and percussionAbdomen - soft, non-tender / non-distended, no masses, no hepatosplenomegalySkin - no rashes or erythema seen.Joints - no erythema or edemaExtremities - normal without edemaCNS - intactDATA: Reviewed labs, radiology, endoscopy and pathology with the patient.Assessment and Plan1. Colonoscopy - discussed with the patient who understands and agrees to the procedure and is a candidate for anesthesia.ASA 2Discussed with Dr. Valorie Roosevelt.Raquel Rozner, MDGastroenterology/Hepatology FellowPreferred contact: [facility]

## 2021-04-11 NOTE — Anesthesia Post-Procedure Evaluation
Anesthesia Post-op NotePatient: Dalton Longs McLaughlinProcedure(s):  Procedure(s) (LRB):COLONOSCOPY, FLEXIBLE; DX, W/WO SPECIMENS/COLON DECOMP (SEP PROC) (N/A) Patient location: PACULast Vitals:  I have noted the vital signs as listed in the nursing notes.Mental status recovered: patient participates in evaluation: YesVital signs reviewed: YesRespiratory function stable:YesAirway is patent: YesCardiovascular function and hydration status stable: YesPain control satisfactory: YesNausea and vomiting control satisfactory:YesNo notable events documented.

## 2021-04-11 NOTE — Other
Post Anesthesia Transfer of Care NotePatient: Dalton Longs McLaughlinProcedure(s) Performed: Procedure(s) (LRB):COLONOSCOPY, FLEXIBLE; DX, W/WO SPECIMENS/COLON DECOMP (SEP PROC) (N/A) Patient location: PACU Last Vitals: Vitals Value Taken Time BP 124/79 04/11/21 1624 Temp 36.8 ?C 04/11/21 1624 Pulse 66 04/11/21 1624 Resp 18 04/11/21 1624 SpO2 99 % 04/11/21 1624 Level of consciousness: awakeTransport Vital Signs:  Stable since the last set of recorded intra-operative vital signsIntra-operative Complications: noneIntra-operative Intake & Output and Antibiotics as per Anesthesia record and discussed with the RN.

## 2021-04-11 NOTE — Other
Pt underwent colonoscopy under anesthesia. Polyps removed by cold snare and forceps. All specimens reviewed with Dr.Proctor.

## 2021-04-13 ENCOUNTER — Encounter: Admit: 2021-04-13 | Payer: PRIVATE HEALTH INSURANCE | Attending: Gastroenterology | Primary: Internal Medicine

## 2021-09-26 ENCOUNTER — Encounter: Admit: 2021-09-26 | Payer: PRIVATE HEALTH INSURANCE | Attending: Internal Medicine | Primary: Internal Medicine

## 2021-09-26 MED ORDER — TRIAMTERENE 37.5 MG-HYDROCHLOROTHIAZIDE 25 MG TABLET
ORAL_TABLET | Freq: Every day | ORAL | 4 refills | Status: AC
Start: 2021-09-26 — End: ?

## 2021-09-26 NOTE — Telephone Encounter
erx'd start date 9/12/23CC Please schedule pt PE with ME for February 2024

## 2021-09-26 NOTE — Telephone Encounter
I called pt 1x LVM to c/b

## 2021-09-28 NOTE — Telephone Encounter
I called pt lvm to c/b

## 2021-10-01 ENCOUNTER — Encounter: Admit: 2021-10-01 | Payer: PRIVATE HEALTH INSURANCE | Primary: Internal Medicine

## 2021-10-01 NOTE — Telephone Encounter
Sent Mychart Message to contact to schedule appt.

## 2021-10-02 NOTE — Telephone Encounter
Unable to reach pt please send letter to home. Thank you.

## 2021-10-08 ENCOUNTER — Encounter: Admit: 2021-10-08 | Payer: PRIVATE HEALTH INSURANCE | Primary: Internal Medicine

## 2021-10-08 NOTE — Telephone Encounter
Letter sent to pt

## 2021-10-18 ENCOUNTER — Encounter (HOSPITAL_COMMUNITY): Payer: Self-pay

## 2021-10-18 ENCOUNTER — Ambulatory Visit (HOSPITAL_COMMUNITY)
Admission: EM | Admit: 2021-10-18 | Discharge: 2021-10-18 | Disposition: A | Discharge status: Home / Self Care | Payer: 59 | Attending: Family Medicine | Admitting: Family Medicine

## 2021-10-18 ENCOUNTER — Encounter: Admit: 2021-10-18 | Payer: PRIVATE HEALTH INSURANCE | Attending: Internal Medicine | Primary: Internal Medicine

## 2021-10-18 DIAGNOSIS — H02843 Edema of right eye, unspecified eyelid: Secondary | ICD-10-CM | POA: Diagnosis not present

## 2021-10-18 NOTE — ED Triage Notes (Signed)
Pt reports right eye bump x 3 weeks. Pt would like for this bump to be removed.

## 2021-10-18 NOTE — ED Provider Notes (Signed)
  [facility]   [id number] 10/18/21 Arrival Time: 8270  ASSESSMENT & PLAN:  1. Swelling of eyelid, right    Orders Placed This Encounter  Procedures   Ambulatory referral to Ophthalmology   Additional resources:  Follow-up Information     Call  [facility]   Contact information: [address] [place] [address] [phone]         Call  Jola Schmidt, MD.   Specialty: Ophthalmology Contact information: [place] [address] [phone]                No signs of infection. Not impairing vision.  Reviewed expectations re: course of current medical issues. Questions answered. Outlined signs and symptoms indicating need for more acute intervention. Patient verbalized understanding. After Visit Summary given.   SUBJECTIVE:  [name] is a 41 y.o. male who presents with complaint of R upper eyelid "swelling" or mass; x 3 weeks; grad onset. No pain/drainage/bleeding. No tx PTA. No h/o similar. No injury.  OBJECTIVE:  Vitals:   10/18/21 1021  BP: (!) 137/54  Pulse: 67  Resp: 16  Temp: 98.2 F (36.8 C)  SpO2: 98%    General appearance: alert; no distress HEENT: [place]; AT; PERRLA; no restriction of the extraocular movements OD: without reported pain; without conjunctival injection; without drainage; without corneal opacities; without limbal flush; with approx 3-2mm round area of swelling over medial canthus Neck: supple without LAD Skin: warm and dry Psychological: alert and cooperative; normal mood and affect     Allergies  Allergen Reactions   Fish Allergy Swelling    SWELLING LIPS   Penicillins     Past Medical History:  Diagnosis Date   Achilles tendon rupture 08/21/2013   left   Social History   Socioeconomic History   Marital status: Single    Spouse name: Not on file   Number of children: Not on file   Years of education: Not on file   Highest education level: Not on  file  Occupational History   Not on file  Tobacco Use   Smoking status: Every Day    Types: Cigars   Smokeless tobacco: Never  Vaping Use   Vaping Use: Never used  Substance and Sexual Activity   Alcohol use: Yes    Comment: socially   Drug use: No   Sexual activity: Not on file  Other Topics Concern   Not on file  Social History Narrative   Not on file   Social Determinants of Health   Financial Resource Strain: Not on file  Food Insecurity: Not on file  Transportation Needs: Not on file  Physical Activity: Not on file  Stress: Not on file  Social Connections: Not on file  Intimate Partner Violence: Not on file   History reviewed. No pertinent family history. Past Surgical History:  Procedure Laterality Date   ACHILLES TENDON SURGERY Left 08/27/2013   Procedure: REPAIR LEFT RUPTURED ACHILLES TENDON PRIMARY OPEN/PERCUTANEOUS ;  Surgeon: Renette Butters, MD;  Location: [facility];  Service: Orthopedics;  Laterality: Left;   NO PAST SURGERIES        Vanessa Kick, MD 10/18/21 1051

## 2022-01-01 ENCOUNTER — Encounter: Admit: 2022-01-01 | Payer: PRIVATE HEALTH INSURANCE | Attending: Internal Medicine | Primary: Internal Medicine

## 2022-01-07 ENCOUNTER — Encounter: Admit: 2022-01-07 | Payer: PRIVATE HEALTH INSURANCE | Primary: Internal Medicine

## 2022-01-10 ENCOUNTER — Telehealth: Admit: 2022-01-10 | Payer: PRIVATE HEALTH INSURANCE | Primary: Internal Medicine

## 2022-01-10 NOTE — Telephone Encounter
Called patient to schedule colon and left VM. Called 1x

## 2022-01-11 ENCOUNTER — Telehealth: Admit: 2022-01-11 | Payer: PRIVATE HEALTH INSURANCE | Primary: Internal Medicine

## 2022-01-11 NOTE — Telephone Encounter
Called patient to schedule colon and left VM.Called 2x

## 2022-01-15 ENCOUNTER — Encounter: Admit: 2022-01-15 | Payer: PRIVATE HEALTH INSURANCE | Primary: Internal Medicine

## 2022-01-15 ENCOUNTER — Telehealth: Admit: 2022-01-15 | Payer: PRIVATE HEALTH INSURANCE | Primary: Internal Medicine

## 2022-01-15 NOTE — Telephone Encounter
COLON appt has been scheduled w/DR Earl Gala in [facility] on 04/15/2022.  Prep was reviewed and understood by pt and sent to pt via [facility]. Pt understands hospital will call w/arrival time.  It was explained to pt he will get a confirmation call/text 14 days/10 days prior; if appt has not been confirmed it could be cancelled - pt understood.  Pt understands he will need someone to accompany him home.  Pharmacy was verified -  [facility], [place] - [address]

## 2022-02-19 ENCOUNTER — Encounter: Admit: 2022-02-19 | Payer: PRIVATE HEALTH INSURANCE | Attending: Hematology and Oncology | Primary: Internal Medicine

## 2022-02-19 DIAGNOSIS — I2699 Other pulmonary embolism without acute cor pulmonale: Secondary | ICD-10-CM

## 2022-02-19 MED ORDER — RIVAROXABAN 20 MG TABLET
20 mg | ORAL_TABLET | Freq: Every day | ORAL | 5 refills | Status: AC
Start: 2022-02-19 — End: 2022-02-22

## 2022-02-19 NOTE — Telephone Encounter
Hi,Patient called the office, to request a medication refill WUJ:[id number] (XARELTO) 20 mg tablet be sent to: [facility] located [address], [place], [place] [address]. Please advise.

## 2022-02-22 ENCOUNTER — Encounter: Admit: 2022-02-22 | Payer: PRIVATE HEALTH INSURANCE | Attending: Hematology | Primary: Internal Medicine

## 2022-02-22 ENCOUNTER — Encounter: Admit: 2022-02-22 | Payer: PRIVATE HEALTH INSURANCE | Attending: Adult Health | Primary: Internal Medicine

## 2022-02-22 ENCOUNTER — Telehealth: Admit: 2022-02-22 | Payer: PRIVATE HEALTH INSURANCE | Attending: Hematology and Oncology | Primary: Internal Medicine

## 2022-02-22 DIAGNOSIS — I2699 Other pulmonary embolism without acute cor pulmonale: Secondary | ICD-10-CM

## 2022-02-22 MED ORDER — RIVAROXABAN 20 MG TABLET
20 mg | ORAL_TABLET | Freq: Every day | ORAL | 5 refills | Status: AC
Start: 2022-02-22 — End: ?

## 2022-02-22 NOTE — Telephone Encounter
Patient called office, his rivaroxaban (XARELTO) 20 mg tablet went to shop right, needs to be called [facility] [address]

## 2022-03-02 ENCOUNTER — Encounter: Admit: 2022-03-02 | Payer: PRIVATE HEALTH INSURANCE | Attending: Internal Medicine | Primary: Internal Medicine

## 2022-03-04 MED ORDER — AMLODIPINE 10 MG TABLET
10 mg | ORAL_TABLET | 4 refills | Status: AC
Start: 2022-03-04 — End: 2022-03-07

## 2022-03-04 NOTE — Telephone Encounter
erxd

## 2022-03-05 ENCOUNTER — Encounter: Admit: 2022-03-05 | Payer: PRIVATE HEALTH INSURANCE | Attending: Internal Medicine | Primary: Internal Medicine

## 2022-03-07 MED ORDER — AMLODIPINE 10 MG TABLET
10 mg | ORAL_TABLET | Freq: Every day | ORAL | 4 refills | Status: AC
Start: 2022-03-07 — End: ?

## 2022-03-07 MED ORDER — TRIAMTERENE 37.5 MG-HYDROCHLOROTHIAZIDE 25 MG TABLET
37.5-25 mg | ORAL_TABLET | Freq: Every day | ORAL | 4 refills | Status: AC
Start: 2022-03-07 — End: ?

## 2022-03-07 NOTE — Telephone Encounter
erxd

## 2022-03-07 NOTE — Telephone Encounter
Patient says the script was sent to [facility], [facility], [place] - [address]

## 2022-03-12 ENCOUNTER — Ambulatory Visit: Admit: 2022-03-12 | Payer: BLUE CROSS/BLUE SHIELD | Attending: Hematology and Oncology | Primary: Internal Medicine

## 2022-03-19 ENCOUNTER — Ambulatory Visit: Admit: 2022-03-19 | Payer: BLUE CROSS/BLUE SHIELD | Attending: Hematology and Oncology | Primary: Internal Medicine

## 2022-03-19 DIAGNOSIS — I2699 Other pulmonary embolism without acute cor pulmonale: Secondary | ICD-10-CM
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
## 2022-03-26 ENCOUNTER — Encounter: Admit: 2022-03-26 | Payer: PRIVATE HEALTH INSURANCE | Attending: Family | Primary: Internal Medicine

## 2022-03-26 ENCOUNTER — Ambulatory Visit: Admit: 2022-03-26 | Payer: BLUE CROSS/BLUE SHIELD | Attending: Family | Primary: Internal Medicine

## 2022-03-26 DIAGNOSIS — E559 Vitamin D deficiency, unspecified: Secondary | ICD-10-CM

## 2022-03-26 DIAGNOSIS — Z0000 Encounter for general adult medical examination without abnormal findings: Secondary | ICD-10-CM

## 2022-03-26 DIAGNOSIS — I10 Essential (primary) hypertension: Secondary | ICD-10-CM

## 2022-03-26 DIAGNOSIS — I2699 Other pulmonary embolism without acute cor pulmonale: Secondary | ICD-10-CM

## 2022-03-26 DIAGNOSIS — I7300 Raynaud's syndrome without gangrene: Secondary | ICD-10-CM

## 2022-03-26 DIAGNOSIS — D6861 Antiphospholipid syndrome: Secondary | ICD-10-CM

## 2022-03-26 DIAGNOSIS — F1020 Alcohol dependence, uncomplicated: Secondary | ICD-10-CM

## 2022-03-26 DIAGNOSIS — M109 Gout, unspecified: Secondary | ICD-10-CM

## 2022-03-26 DIAGNOSIS — M87059 Idiopathic aseptic necrosis of unspecified femur: Secondary | ICD-10-CM
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
## 2022-03-28 ENCOUNTER — Telehealth: Admit: 2022-03-28 | Payer: PRIVATE HEALTH INSURANCE | Attending: Orthopedic Surgery | Primary: Internal Medicine

## 2022-03-28 NOTE — Telephone Encounter
Referral placed for pt for Question of AVN bilateral hips, seen on two imaging studies, no current symptoms. Pls advise if pt should be scheduled next available with Dr. Darlis Loan or seen sooner. Thank you :)

## 2022-03-29 NOTE — Telephone Encounter
Reached out to the pt and left a message in regards to scheduling appt.  Chart is noted w/instructions for call back. Thank you and have a good day

## 2022-03-31 ENCOUNTER — Encounter
Admit: 2022-03-31 | Payer: PRIVATE HEALTH INSURANCE | Attending: Student in an Organized Health Care Education/Training Program | Primary: Internal Medicine

## 2022-04-01 ENCOUNTER — Encounter: Admit: 2022-04-01 | Payer: PRIVATE HEALTH INSURANCE | Primary: Internal Medicine

## 2022-04-01 MED ORDER — PEG 3350-ELECTROLYTES 236 GRAM-22.74 GRAM-6.74 GRAM-5.86 GRAM SOLUTION
236-22.74-6.74-5.86 -5.86 gram | Freq: Once | ORAL | 1 refills | Status: AC
Start: 2022-04-01 — End: ?

## 2022-04-07 ENCOUNTER — Encounter: Admit: 2022-04-07 | Payer: PRIVATE HEALTH INSURANCE | Primary: Internal Medicine

## 2022-04-08 ENCOUNTER — Encounter: Admit: 2022-04-08 | Payer: PRIVATE HEALTH INSURANCE | Primary: Internal Medicine

## 2022-04-08 NOTE — Telephone Encounter
First attempt made to conduct pre-procedure call for a colonoscopy on 04/15/2023 at [facility]. No patient contact made, left voice message and sent a MyChart message to return phone call to the GI Navigation Department. Reminded patient to Stop taking his Rivaroxaban for 2 days prior to Procedure

## 2022-04-09 NOTE — Telephone Encounter
Final attempt made to conduct a pre-procedure call for a  a colonoscopy on 04/15/2023 at [facility].  Patient contact not made.  Left voice message with patient and his contact Marcial Pacas) and sent a MyChart message for patient to return call to the GI Navigation department.  Chart sent for NP review.

## 2022-04-10 ENCOUNTER — Ambulatory Visit (HOSPITAL_COMMUNITY)
Admission: EM | Admit: 2022-04-10 | Discharge: 2022-04-10 | Disposition: A | Discharge status: Home / Self Care | Payer: 59 | Attending: Internal Medicine | Admitting: Internal Medicine

## 2022-04-10 ENCOUNTER — Encounter (HOSPITAL_COMMUNITY): Payer: Self-pay

## 2022-04-10 ENCOUNTER — Encounter: Admit: 2022-04-10 | Payer: BLUE CROSS/BLUE SHIELD | Attending: Family | Primary: Internal Medicine

## 2022-04-10 DIAGNOSIS — R1031 Right lower quadrant pain: Secondary | ICD-10-CM | POA: Diagnosis not present

## 2022-04-10 DIAGNOSIS — M62838 Other muscle spasm: Secondary | ICD-10-CM | POA: Diagnosis not present

## 2022-04-10 DIAGNOSIS — Z86010 Personal history of colonic polyps: Secondary | ICD-10-CM

## 2022-04-10 DIAGNOSIS — Z8719 Personal history of other diseases of the digestive system: Secondary | ICD-10-CM

## 2022-04-10 MED ORDER — KETOROLAC TROMETHAMINE 30 MG/ML IJ SOLN
30.0000 mg | Freq: Once | INTRAMUSCULAR | Status: AC
  Administered 2022-04-10: 30 mg via INTRAMUSCULAR

## 2022-04-10 MED ORDER — KETOROLAC TROMETHAMINE 30 MG/ML IJ SOLN
INTRAMUSCULAR | Status: AC
  Filled 2022-04-10: qty 1

## 2022-04-10 MED ORDER — ACETAMINOPHEN 325 MG PO TABS
ORAL_TABLET | ORAL | Status: AC
  Filled 2022-04-10: qty 3

## 2022-04-10 MED ORDER — METHOCARBAMOL 500 MG PO TABS: 500.0000 mg | ORAL_TABLET | Freq: Two times a day (BID) | ORAL | 0 refills | Status: DC

## 2022-04-10 MED ORDER — ACETAMINOPHEN 325 MG PO TABS
975.0000 mg | ORAL_TABLET | Freq: Once | ORAL | Status: AC
  Administered 2022-04-10: 975 mg via ORAL

## 2022-04-10 MED ORDER — IBUPROFEN 800 MG PO TABS: 800.0000 mg | ORAL_TABLET | Freq: Three times a day (TID) | ORAL | 0 refills | Status: DC

## 2022-04-10 NOTE — Other
ReviewFinal attempt: GI NURSE NAVIGATOR: UNABLE TO CONTACT PT FOR PRE-OP CALL, hence unable to assess functional status, Evaluate on DOS Review NP: [name]

## 2022-04-10 NOTE — ED Provider Notes (Signed)
[facility]    CSN: [id number] Arrival date & time: 04/10/22  1572      History   Chief Complaint Chief Complaint  Patient presents with   Abdominal Pain    HPI [name] is a 42 y.o. male.   Patient presents to urgent care for evaluation of right lower quadrant abdominal discomfort that radiates to the right lower back that started 2 days ago on Monday, April 08, 2022.  Patient plays baseball and played approximately 6 games over the weekend 3 to 4 days ago.  He also works a very physically demanding job and started to experience slight ache to the right lower quadrant abdomen a couple of weeks ago but believes that playing baseball may have made this worse over the weekend.  He denies nausea, vomiting, diarrhea, extremity weakness, saddle anesthesia symptoms, urinary symptoms, fever/chills, constipation, blood/mucus in the stools, dizziness, headache, viral URI symptoms, and bowel/bladder incontinence.  No history of any abdominal surgeries.  No recent antibiotics or steroids.  Denies penile discharge or concern for STD.  No rash reported.  Pain to the right lower quadrant abdomen is currently an 8 on a scale of 0-10 and is much worse with movement.  He attempted Epsom salt soaks and ibuprofen which he states helped significantly with his discomfort.  He has not had any ibuprofen today, last dose yesterday.  He has not attempted any other over-the-counter medications or remedies before coming to urgent care.  No recent known trauma to the abdomen or the low back.  No chest pain or shortness of breath.   Abdominal Pain   Past Medical History:  Diagnosis Date   Achilles tendon rupture 08/21/2013   left    There are no problems to display for this patient.   Past Surgical History:  Procedure Laterality Date   ACHILLES TENDON SURGERY Left 08/27/2013   Procedure: REPAIR LEFT RUPTURED ACHILLES TENDON PRIMARY OPEN/PERCUTANEOUS ;  Surgeon: Renette Butters, MD;   Location: [facility];  Service: Orthopedics;  Laterality: Left;   NO PAST SURGERIES         Home Medications    Prior to Admission medications   Medication Sig Start Date End Date Taking? Authorizing Provider  acetaminophen (TYLENOL) 325 MG tablet Take 2 tablets (650 mg total) by mouth every 6 (six) hours as needed. 02/12/17  Yes Couture, Cortni S, PA-C  ibuprofen (ADVIL) 800 MG tablet Take 1 tablet (800 mg total) by mouth 3 (three) times daily. 04/10/22  Yes Talbot Grumbling, FNP  methocarbamol (ROBAXIN) 500 MG tablet Take 1 tablet (500 mg total) by mouth 2 (two) times daily. 04/10/22  Yes Talbot Grumbling, FNP    Family History History reviewed. No pertinent family history.  Social History Social History   Tobacco Use   Smoking status: Every Day    Types: Cigars   Smokeless tobacco: Never  Vaping Use   Vaping Use: Never used  Substance Use Topics   Alcohol use: Yes    Comment: socially   Drug use: No     Allergies   Fish allergy and Penicillins   Review of Systems Review of Systems  Gastrointestinal:  Positive for abdominal pain.  Per HPI   Physical Exam Triage Vital Signs ED Triage Vitals  Enc Vitals Group     BP 04/10/22 1003 (!) 134/90     Pulse Rate 04/10/22 1003 64     Resp 04/10/22 1003 16     Temp  04/10/22 1003 98 F (36.7 C)     Temp Source 04/10/22 1003 Oral     SpO2 04/10/22 1003 99 %     Weight --      Height --      Head Circumference --      Peak Flow --      Pain Score 04/10/22 1005 8     Pain Loc --      Pain Edu? --      Excl. in [place]? --    No data found.  Updated Vital Signs BP (!) 134/90 (BP Location: Left Arm)   Pulse 64   Temp 98 F (36.7 C) (Oral)   Resp 16   SpO2 99%   Visual Acuity Right Eye Distance:   Left Eye Distance:   Bilateral Distance:    Right Eye Near:   Left Eye Near:    Bilateral Near:     Physical Exam Vitals and nursing note reviewed.  Constitutional:      Appearance: He  is not ill-appearing or toxic-appearing.  HENT:     Head: Normocephalic and atraumatic.     Right Ear: Hearing and external ear normal.     Left Ear: Hearing and external ear normal.     Nose: Nose normal.     Mouth/Throat:     Lips: Pink.  Eyes:     General: Lids are normal. Vision grossly intact. Gaze aligned appropriately.     Extraocular Movements: Extraocular movements intact.     Conjunctiva/sclera: Conjunctivae normal.  Cardiovascular:     Rate and Rhythm: Normal rate and regular rhythm.     Heart sounds: Normal heart sounds, S1 normal and S2 normal.  Pulmonary:     Effort: Pulmonary effort is normal. No respiratory distress.     Breath sounds: Normal breath sounds and air entry.  Abdominal:     General: Abdomen is flat. Bowel sounds are normal.     Palpations: Abdomen is soft.     Tenderness: There is abdominal tenderness in the right lower quadrant. There is no right CVA tenderness, left CVA tenderness, guarding or rebound. Negative signs include Murphy's sign, Rovsing's sign, McBurney's sign and psoas sign.     Comments: No peritoneal signs to abdominal exam.  Pain to the abdomen is worse with position changes and movement during exam.  TTP to the right lower quadrant abdomen that can be elicited with palpation around the right side and to the right lower lumbar paraspinals.  Musculoskeletal:     Cervical back: Normal and neck supple.     Thoracic back: Normal.     Lumbar back: Spasms and tenderness present. No swelling, edema, deformity, signs of trauma, lacerations or bony tenderness. Normal range of motion. Negative right straight leg raise test and negative left straight leg raise test.     Comments: Sensation and strength intact to bilateral lower extremities.  Skin:    General: Skin is warm and dry.     Capillary Refill: Capillary refill takes less than 2 seconds.     Findings: No rash.  Neurological:     General: No focal deficit present.     Mental Status: He is  alert and oriented to person, place, and time. Mental status is at baseline.     Cranial Nerves: No dysarthria or facial asymmetry.  Psychiatric:        Mood and Affect: Mood normal.        Speech: Speech normal.  Behavior: Behavior normal.        Thought Content: Thought content normal.        Judgment: Judgment normal.      UC Treatments / Results  Labs (all labs ordered are listed, but only abnormal results are displayed) Labs Reviewed - No data to display  EKG   Radiology No results found.  Procedures Procedures (including critical care time)  Medications Ordered in UC Medications  ketorolac (TORADOL) 30 MG/ML injection 30 mg (has no administration in time range)  acetaminophen (TYLENOL) tablet 975 mg (has no administration in time range)    Initial Impression / Assessment and Plan / UC Course  I have reviewed the triage vital signs and the nursing notes.  Pertinent labs & imaging results that were available during my care of the patient were reviewed by me and considered in my medical decision making (see chart for details).   1.  Right lower quadrant abdominal pain, muscle spasm Presentation is consistent with acute muscle strain of the right lower abdomen that will likely resolve with rest, fluids, as needed use of ibuprofen and muscle relaxer, heat, and gentle range of motion exercises. May take ibuprofen 800mg  every 8 hours and robaxin muscle relaxer every 12 hours as needed for muscle spasm. Ketorolac 30mg  IM and tylenol 975mg  tylenol given for acute pain. No peritoneal signs to abdominal exam, low suspicion for intra-abdominal pathology.  No ibuprofen/other NSAID until tomorrow due to ketorolac injection in clinic.  Drowsiness precautions regarding muscle relaxer use discussed. Heat and gentle ROM exercises discussed. Deferred imaging today based on stable musculoskeletal exam findings and hemodynamically stable vital signs. Walking referral given to orthopedic  provider should symptoms fail to improve in the next 1-2 weeks.   Discussed physical exam and available lab work findings in clinic with patient.  Counseled patient regarding appropriate use of medications and potential side effects for all medications recommended or prescribed today. Discussed red flag signs and symptoms of worsening condition,when to call the PCP office, return to urgent care, and when to seek higher level of care in the emergency department. Patient verbalizes understanding and agreement with plan. All questions answered. Patient discharged in stable condition.    Final Clinical Impressions(s) / UC Diagnoses   Final diagnoses:  Right lower quadrant abdominal pain  Muscle spasm     Discharge Instructions      Your pain is likely due to a muscle strain which will improve on its own with time.   - Take ibuprofen 800mg  with food every 8 hours as needed for pain and inflammation. Do not take any other NSAID containing medicine when taking ibuprofen.   - You may start taking ibuprofen tomorrow since you were given a dose of ketorolac injection in clinic today for pain and inflammation. - You may also take the prescribed muscle relaxer as directed as needed for muscle aches/spasm.  Do not take this medication and drive or drink alcohol as it can make you sleepy.  Mainly use this medicine at nighttime as needed. - Apply heat 20 minutes on then 20 minutes off and perform gentle range of motion exercises to the area of greatest pain to prevent muscle stiffness and provide further pain relief.   Red flag symptoms to watch out for are numbness/tingling to the legs, weakness, loss of bowel/bladder control, and/or worsening pain that does not respond well to medicines. Follow-up with your primary care provider or return to urgent care if your symptoms do not improve in  the next 3 to 4 days with medications and interventions recommended today. If your symptoms are severe (red flag),  please go to the emergency room.  I hope you feel better!     ED Prescriptions     Medication Sig Dispense Auth. Provider   methocarbamol (ROBAXIN) 500 MG tablet Take 1 tablet (500 mg total) by mouth 2 (two) times daily. 20 tablet Joella Prince M, FNP   ibuprofen (ADVIL) 800 MG tablet Take 1 tablet (800 mg total) by mouth 3 (three) times daily. 21 tablet Talbot Grumbling, FNP      PDMP not reviewed this encounter.   Talbot Grumbling, [place] 04/10/22 1101

## 2022-04-10 NOTE — ED Triage Notes (Signed)
Pt is here for abdominal pain . Pt has been taking tylenol and ibuprofen yesterday but, it did not help .

## 2022-04-10 NOTE — Discharge Instructions (Addendum)
Your pain is likely due to a muscle strain which will improve on its own with time.   - Take ibuprofen 800mg  with food every 8 hours as needed for pain and inflammation. Do not take any other NSAID containing medicine when taking ibuprofen.   - You may start taking ibuprofen tomorrow since you were given a dose of ketorolac injection in clinic today for pain and inflammation. - You may also take the prescribed muscle relaxer as directed as needed for muscle aches/spasm.  Do not take this medication and drive or drink alcohol as it can make you sleepy.  Mainly use this medicine at nighttime as needed. - Apply heat 20 minutes on then 20 minutes off and perform gentle range of motion exercises to the area of greatest pain to prevent muscle stiffness and provide further pain relief.   Red flag symptoms to watch out for are numbness/tingling to the legs, weakness, loss of bowel/bladder control, and/or worsening pain that does not respond well to medicines. Follow-up with your primary care provider or return to urgent care if your symptoms do not improve in the next 3 to 4 days with medications and interventions recommended today. If your symptoms are severe (red flag), please go to the emergency room.  I hope you feel better!

## 2022-04-12 ENCOUNTER — Telehealth: Admit: 2022-04-12 | Payer: PRIVATE HEALTH INSURANCE | Primary: Internal Medicine

## 2022-04-12 ENCOUNTER — Encounter: Admit: 2022-04-12 | Payer: PRIVATE HEALTH INSURANCE | Primary: Internal Medicine

## 2022-04-12 NOTE — Telephone Encounter
Contacted Dr. Merlinda Frederick and understood that he needs earlier time for colon on 04/15/22. I was able to move him to 8AM arrival time 9AM procedure in [facility] w/ Dr. Kennedy Bucker.I left VM notifying him of updated arrival time and sent mychart message.

## 2022-04-13 ENCOUNTER — Inpatient Hospital Stay: Admit: 2022-04-13 | Discharge: 2022-04-13 | Payer: BLUE CROSS/BLUE SHIELD | Primary: Internal Medicine

## 2022-04-13 DIAGNOSIS — E559 Vitamin D deficiency, unspecified: Secondary | ICD-10-CM

## 2022-04-13 DIAGNOSIS — Z0000 Encounter for general adult medical examination without abnormal findings: Secondary | ICD-10-CM

## 2022-04-13 DIAGNOSIS — I2699 Other pulmonary embolism without acute cor pulmonale: Secondary | ICD-10-CM

## 2022-04-13 LAB — CBC WITH AUTO DIFFERENTIAL
BKR GLUCOSE: 35.6 g/dL (ref 31.0–36.0)
BKR WAM ABSOLUTE IMMATURE GRANULOCYTES.: 0.01 x 1000/??L (ref 0.00–0.30)
BKR WAM ABSOLUTE LYMPHOCYTE COUNT.: 1.73 x 1000/??L (ref 0.60–3.70)
BKR WAM ABSOLUTE NRBC (2 DEC): 0 x 1000/??L (ref 0.00–1.00)
BKR WAM ANALYZER ANC: 1.88 x 1000/ÂµL — ABNORMAL LOW (ref 2.00–7.60)
BKR WAM BASOPHIL ABSOLUTE COUNT.: 0.03 x 1000/??L (ref 0.00–1.00)
BKR WAM BASOPHILS: 0.7 % (ref 0.0–1.4)
BKR WAM EOSINOPHIL ABSOLUTE COUNT.: 0.15 x 1000/ÂµL (ref 0.00–1.00)
BKR WAM EOSINOPHILS: 3.7 % (ref 0.0–5.0)
BKR WAM HEMATOCRIT (2 DEC): 42.4 % (ref 38.50–50.00)
BKR WAM HEMOGLOBIN: 15.1 g/dL (ref 13.2–17.1)
BKR WAM IMMATURE GRANULOCYTES: 0.2 % (ref 0.0–1.0)
BKR WAM LYMPHOCYTES: 42.4 % (ref 17.0–50.0)
BKR WAM MCH (PG): 33 pg (ref 27.0–33.0)
BKR WAM MCHC: 35.6 g/dL (ref 31.0–36.0)
BKR WAM MCV: 92.6 fL (ref 80.0–100.0)
BKR WAM MONOCYTE ABSOLUTE COUNT.: 0.28 x 1000/??L (ref 0.00–1.00)
BKR WAM MPV: 11 fL (ref 8.0–12.0)
BKR WAM NEUTROPHILS: 46.1 % (ref 39.0–72.0)
BKR WAM NUCLEATED RED BLOOD CELLS: 0 % (ref 0.0–1.0)
BKR WAM PLATELETS: 217 x1000/??L (ref 150–420)
BKR WAM RDW-CV: 12.1 % (ref 11.0–15.0)
BKR WAM RED BLOOD CELL COUNT.: 4.58 M/??L (ref 4.00–6.00)
BKR WAM WHITE BLOOD CELL COUNT: 4.1 x1000/??L (ref 4.0–11.0)

## 2022-04-13 LAB — COMPREHENSIVE METABOLIC PANEL
BKR A/G RATIO: 1.9 (ref 1.0–2.2)
BKR ALANINE AMINOTRANSFERASE (ALT): 14 U/L (ref 9–59)
BKR ALBUMIN: 4.7 g/dL (ref 3.6–4.9)
BKR ALKALINE PHOSPHATASE: 51 U/L (ref 9–122)
BKR ANION GAP: 10 pg (ref 7–17)
BKR ASPARTATE AMINOTRANSFERASE (AST): 21 U/L (ref 10–35)
BKR AST/ALT RATIO: 1.5
BKR BILIRUBIN TOTAL: 0.9 mg/dL (ref 0.0–<=1.2)
BKR BLOOD UREA NITROGEN: 21 mg/dL — ABNORMAL HIGH (ref 6–20)
BKR BUN / CREAT RATIO: 17.5 % (ref 8.0–23.0)
BKR CALCIUM: 9.7 mg/dL (ref 8.8–10.2)
BKR CHLORIDE: 100 mmol/L (ref 98–107)
BKR CO2: 30 mmol/L (ref 20–30)
BKR CREATININE: 1.2 mg/dL (ref 0.40–1.30)
BKR EGFR, CREATININE (CKD-EPI 2021): >60 mL/min/{1.73_m2} (ref >=60–1.00)
BKR GLOBULIN: 2.5 g/dL — ABNORMAL LOW (ref 2.3–3.5)
BKR POTASSIUM: 4.1 mmol/L (ref 3.3–5.3)
BKR PROTEIN TOTAL: 7.2 g/dL (ref 6.6–8.7)
BKR SODIUM: 140 mmol/L (ref 136–144)
BKR WAM MONOCYTES: 4.7 g/dL (ref 3.6–4.9)

## 2022-04-13 LAB — LIPID PANEL
BKR CHOLESTEROL/HDL RATIO: 2.4 (ref 0.0–5.0)
BKR CHOLESTEROL: 141 mg/dL
BKR HDL CHOLESTEROL: 60 mg/dL (ref >=40)
BKR LDL CHOLESTEROL SAMPSON CALCULATED: 69 mg/dL
BKR TRIGLYCERIDES: 59 mg/dL

## 2022-04-13 LAB — TSH W/REFLEX TO FT4     (BH GH LMW Q YH): BKR THYROID STIMULATING HORMONE: 1.91 u[IU]/mL

## 2022-04-15 ENCOUNTER — Ambulatory Visit: Admit: 2022-04-15 | Payer: BLUE CROSS/BLUE SHIELD | Attending: Family | Primary: Internal Medicine

## 2022-04-15 ENCOUNTER — Encounter: Admit: 2022-04-15 | Payer: BLUE CROSS/BLUE SHIELD | Primary: Internal Medicine

## 2022-04-15 ENCOUNTER — Encounter: Admit: 2022-04-15 | Payer: PRIVATE HEALTH INSURANCE | Attending: Gastroenterology | Primary: Internal Medicine

## 2022-04-15 ENCOUNTER — Inpatient Hospital Stay
Admit: 2022-04-15 | Discharge: 2022-04-15 | Payer: BLUE CROSS/BLUE SHIELD | Attending: Gastroenterology | Primary: Internal Medicine

## 2022-04-15 DIAGNOSIS — Z86711 Personal history of pulmonary embolism: Secondary | ICD-10-CM

## 2022-04-15 DIAGNOSIS — F1020 Alcohol dependence, uncomplicated: Secondary | ICD-10-CM

## 2022-04-15 DIAGNOSIS — D123 Benign neoplasm of transverse colon: Secondary | ICD-10-CM

## 2022-04-15 DIAGNOSIS — Z86010 Personal history of colonic polyps: Secondary | ICD-10-CM

## 2022-04-15 DIAGNOSIS — Z8719 Personal history of other diseases of the digestive system: Secondary | ICD-10-CM

## 2022-04-15 DIAGNOSIS — I10 Essential (primary) hypertension: Secondary | ICD-10-CM

## 2022-04-15 DIAGNOSIS — K635 Polyp of colon: Secondary | ICD-10-CM

## 2022-04-15 DIAGNOSIS — M109 Gout, unspecified: Secondary | ICD-10-CM

## 2022-04-15 DIAGNOSIS — I2699 Other pulmonary embolism without acute cor pulmonale: Secondary | ICD-10-CM

## 2022-04-15 DIAGNOSIS — K5730 Diverticulosis of large intestine without perforation or abscess without bleeding: Secondary | ICD-10-CM

## 2022-04-15 DIAGNOSIS — D6861 Antiphospholipid syndrome: Secondary | ICD-10-CM

## 2022-04-15 DIAGNOSIS — Z7901 Long term (current) use of anticoagulants: Secondary | ICD-10-CM

## 2022-04-15 DIAGNOSIS — Z79899 Other long term (current) drug therapy: Secondary | ICD-10-CM

## 2022-04-15 DIAGNOSIS — I7300 Raynaud's syndrome without gangrene: Secondary | ICD-10-CM

## 2022-04-15 DIAGNOSIS — E559 Vitamin D deficiency, unspecified: Secondary | ICD-10-CM

## 2022-04-15 MED ORDER — SODIUM CHLORIDE 0.9 % (FLUSH) INJECTION SYRINGE
0.9 % | INTRAVENOUS | Status: DC | PRN
Start: 2022-04-15 — End: 2022-04-15

## 2022-04-15 MED ORDER — LIDOCAINE (PF) 20 MG/ML (2 %) INJECTION SOLUTION
202 mg/mL (2 %) | INTRAVENOUS | Status: DC | PRN
Start: 2022-04-15 — End: 2022-04-15
  Administered 2022-04-15: 14:00:00 20 mg/mL (2 %) via INTRAVENOUS

## 2022-04-15 MED ORDER — PROPOFOL 10 MG/ML INTRAVENOUS EMULSION
10 mg/mL | Status: DC | PRN
Start: 2022-04-15 — End: 2022-04-15
  Administered 2022-04-15: 14:00:00 10 mL/h

## 2022-04-15 MED ORDER — LACTATED RINGERS INTRAVENOUS SOLUTION
INTRAVENOUS | Status: DC
Start: 2022-04-15 — End: 2022-04-15
  Administered 2022-04-15: 13:00:00 1000.000 mL/h via INTRAVENOUS

## 2022-04-15 MED ORDER — SODIUM CHLORIDE 0.9 % (FLUSH) INJECTION SYRINGE
0.9 % | Freq: Three times a day (TID) | INTRAVENOUS | Status: DC
Start: 2022-04-15 — End: 2022-04-15

## 2022-04-15 MED ORDER — PROPOFOL 10 MG/ML INTRAVENOUS EMULSION
10 mg/mL | INTRAVENOUS | Status: DC | PRN
Start: 2022-04-15 — End: 2022-04-15
  Administered 2022-04-15: 14:00:00 10 mg/mL via INTRAVENOUS

## 2022-04-15 NOTE — H&P
Pre-procedure noteProcedure - colonoscopyCC - personal history of colonic polypsHPI - 42 yo male with HTN, APLS, history of alcoholism and bilateral PEs presenting for surveillance colonoscopy given personal history of adenomatous colonic polyps in 2023. Pt says that 2023 colonoscopy ws performed for evaluation of left sided abdominal pain. 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 No current outpatient medications on file prior to encounter. Family History - father had asbestos related lung cancer. Negative for GI malignancyPhysical ExamVitals:  04/15/22 0815 BP: 118/81 Pulse: 70 Resp: 18 Temp: 98.5 ?F (36.9 ?C) Gen - Patient in no apparent distress CVS - RRR with S1 and S2 heardResp - Clear to auscultation throughoutAbd - Soft, non-tender and non-distended with normal active bowel soundsData: Endoscopy Report3/22/23 colonoscopy (Dr. Valorie Jones) re CRc screening and constipationFindings:1. There were 8 polyps in total removed - 2 polyps in the ascending colon, ranging in size from 1 cm to 5 mm. Both were removed with the cold snare polypectomy.One polyp at the hepatic flexure, 5 mm in size and removed with the cold snare polypectomy.Five polyps in the rectum / sigmoid - ranging in size from 3 mm to 1 cm. The larger polyps were removed with the cold snare polypectomy; a small 3 mm polyp was removed with the biopsy forceps.2. 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 There were moderate sized, non-bleeding internal hemorrhoids.4. O/w normal colonic and distal ileal mucosa. The exam was otherwise without abnormality.         Related histopathology: FINAL DIAGNOSIS 1.  ASCENDING COLON POLYPS X 2, BIOPSY:       - FRAGMENTS OF SESSILE SERRATED POLYP 2.  HEPATIC FLEXURE COLON POLYP, BIOPSY:       - TUBULAR ADENOMA 3.  RECTOSIGMOID POLYPS X 5, BIOPSY:       - FRAGMENTS OF TUBULAR ADENOMA Assessment and plan: 42 yo male with HTN, APLS, history of alcoholism and bilateral PEs (last dose of Xarelto 3/22nd) presenting for surveillance colonoscopy given personal history of adenomatous colonic polyps in 2023. The patient is an appropriate candidate for the planned colonoscopy. The procedure related risks, benefits and alternatives were discussed and informed consent was obtained.  A procedure report will follow.  Electronically Signed by Dalton Shove, MD, April 15, 2022

## 2022-04-15 NOTE — Discharge Instructions
Patient Education Colon Polypectomy Why is this procedure done? The colon, or large intestine, is a long, hollow tube at the end of your digestive tract. It absorbs water from solid waste and changes it from liquid to a solid bowel movement.A colon polyp is a growth of extra tissue that is not normally in your colon. Colon polyps do not often cause any signs. Most colon polyps are not cancer, but some polyps may turn into cancer. In some cases, larger polyps can cause problems such as bleeding, abdominal pain, or abnormal bowel movements. A colon polyp can be removed safely to prevent cancer or improve other symptoms. The doctor takes the polyps out during a procedure called a colonoscopy and sends them to the lab for a check to make sure there is no cancer.There are three types of colon polyps:A polyp that will not be cancerA polyp that does not often develop into cancer, but canA polyp that is going to become a cancer or is a cancerYou may have a colon polyp or multiple polyps removed. The doctor will send them to the lab to see what type they are. If a polyp is very large, it may need to be removed by surgery. What will the results be? The polyps are taken out and sent for more tests.What happens before the procedure? Talk to the doctor about:All the drugs you are taking. Be sure to include all prescription and over-the-counter (OTC) drugs, and herbal supplements. Tell the doctor about any drug allergy. Bring a list of drugs you take with you.Any bleeding problems. Be sure to tell your doctor if you are taking any drugs that may cause bleeding. Some of these are warfarin, rivaroxaban, apixaban, ticagrelor, clopidogrel, ketorolac, ibuprofen, naproxen, or aspirin. Certain vitamins and herbs, such as garlic and fish oil, may also add to the risk for bleeding. You may need to stop these drugs as well. Talk to your doctor about them.The colon needs to be cleaned out before this test. Your doctor will tell you to take drugs that will cause watery loose stools. These may be liquids, pills, or both. It is very important that you follow these clean out procedures so that the doctor can see what they need to see.You will be placed on a clear liquid diet the day before the exam. This means you can not eat any solid foods from the day before the procedure until the procedure is done. Do not drink alcohol.Do not eat or drink anything after midnight or after the time your doctor tells you.You will not be allowed to drive right away after the procedure. Ask a family member or a friend to drive you home.Your doctor will take your history and do an exam. The doctor may order:Tests for your stoolX-rays or [place] scanWhat happens during the procedure? Your doctor will give you drugs to help you relax. You will lie on your side with your knees bent and drawn up toward your chest.The doctor will use a small thin tube with a light and a camera on it. This is will be put gently into your anus. The doctor will move it through your rectum and into the bowel.Small amounts of air are put into your colon. The camera lets your doctor look at the lining of your colon.You may feel some pressure in your belly or feel the nurse pressing on your belly during the test. The doctor will guide the tool through your colon and look at all areas that may have any polyps.Your doctor   may take a small tissue sample. Small growths may also be removed during the procedure.The tube is then taken out from your bowel.The procedure may take 30 to 45 minutes.What happens after the procedure? If tissue was taken out, it will be sent to a lab to be checked. Your doctor will tell you the results after a week or two.You can go home after your procedure.A small amount of bleeding may happen during the first few days after your procedure.What problems could happen? Injury inside your colonBleedingHole in the colonWhere can I learn more? BetterHealthhttps://www.betterhealth.[place]/health/ConditionsAndTreatments/colonoscopy NHShttps://www.nhs.uk/conditions/bowel-polyps/ UpToDatehttps://www.uptodate.com/contents/colon-polyps-beyond-the-basics Last Reviewed Date 2021-03-10Consumer Information Use and Disclaimer This generalized information is a limited summary of diagnosis, treatment, and/or medication information. It is not meant to be comprehensive and should be used as a tool to help the user understand and/or assess potential diagnostic and treatment options. It does NOT include all information about conditions, treatments, medications, side effects, or risks that may apply to a specific patient. It is not intended to be medical advice or a substitute for the medical advice, diagnosis, or treatment of a health care provider based on the health care provider's examination and assessment of a patient?s specific and unique circumstances. Patients must speak with a health care provider for complete information about their health, medical questions, and treatment options, including any risks or benefits regarding use of medications. This information does not endorse any treatments or medications as safe, effective, or approved for treating a specific patient. UpToDate, Inc. and its affiliates disclaim any warranty or liability relating to this information or the use thereof. The use of this information is governed by the Terms of Use, available at https://www.wolterskluwer.com/en/know/clinical-effectiveness-terms Copyright Copyright ? 2023 UpToDate, Inc. and its affiliates and/or licensors. All rights reserved.Patient Education Diverticulosis Discharge Instructions About this topic Diverticulosis is a problem of the large bowel or colon. The wall of the bowel becomes weak and pushes outward. They form balloon-like pouches called diverticula or tics. When you have hard stool, you strain to have a bowel movement. This raises the pressure in the bowel and causes pouches or bulges to form. Most often, they do not cause a problem. If they become infected, you have diverticulitis. If you have both bleeding and infection it is diverticular disease. What care is needed at home? Ask your doctor what you need to do when you go home. Make sure you ask questions if you do not understand what the doctor says.Eat more whole grains, vegetables, and fruits.Do not wait to have a bowel movement. Go as soon as you have the urge.Drink 8 to 10 glasses of water each day. Talk to your doctor if you are drinking less fluids due to a health problem.Be active. Walk, garden, or do something active for 30 minutes or more on most days of the week.What follow-up care is needed? Your doctor may ask you to make visits to the office to check on your progress. Be sure to keep these visits.What drugs may be needed? Most often with diverticulosis you will not need to take any drugs.Will physical activity be limited? When you are in pain, you may need to rest in bed. To ease the pain, use a heat compress on your belly. This should last only for a few days.What changes to diet are needed? Talk to your doctor about any changes you need to make to your diet.You do not need to avoid seeds, nuts, corn, or other similar foods. You will need to eat food rich in fiber and drink more   water.Eat 5 or more servings of fresh fruits and vegetables every day.Eat 6 or more servings of whole-wheat grain breads and cereals.Try to get 25 to 30 grams of fiber every day. Read the labels to learn how much fiber is in foods. Do not drink coffee, tea, or beer, wine, and mixed drinks (alcohol).What problems could happen? You may develop diverticulitis, which may cause:Pockets or pouches in your bowel may be infected or filled with pus.Hole or tear in your bowelPart of your bowel to become narrowYou to need surgeryWhat can be done to prevent this health problem? The best way to keep from having diverticulosis is to keep your bowel movements soft and normal. To keep more pouches from forming:Talk with your doctor about adding an over-the-counter (OTC) fiber product to keep your stools soft.Limit how much pain drugs you take. Overuse of some pain drugs can cause hard stools; talk with your doctor.When do I need to call the doctor? Signs of infection. These include a fever of 100.4?F (38?C) or higher, chills.Mild pain or cramping in the lower part of the bellyA feeling of bloating in the bellyBelly pain that gets worseBlood in your stoolUpset stomach or throwing upStools get too loose or too hardLong-term hard stoolsTeach Back: Helping You Understand The Teach Back Method helps you understand the information we are giving you. After you talk with the staff, tell them in your own words what you learned. This helps to make sure the staff has described each thing clearly. It also helps to explain things that may have been confusing. Before going home, make sure you are able to do these:I can tell you about my condition.I can tell you what changes I need to make with my diet or drugs.I can tell you what I will do if I have pain or cramping in my lower belly or I have more belly pain.Where can I learn more? FamilyDoctor.orghttp://familydoctor.org/familydoctor/en/diseases-conditions/diverticular-disease.html NHShttps://www.nhs.uk/conditions/diverticular-disease-and-diverticulitis/ Last Reviewed Date 2021-04-13Consumer Information Use and Disclaimer This generalized information is a limited summary of diagnosis, treatment, and/or medication information. It is not meant to be comprehensive and should be used as a tool to help the user understand and/or assess potential diagnostic and treatment options. It does NOT include all information about conditions, treatments, medications, side effects, or risks that may apply to a specific patient. It is not intended to be medical advice or a substitute for the medical advice, diagnosis, or treatment of a health care provider based on the health care provider's examination and assessment of a patient?s specific and unique circumstances. Patients must speak with a health care provider for complete information about their health, medical questions, and treatment options, including any risks or benefits regarding use of medications. This information does not endorse any treatments or medications as safe, effective, or approved for treating a specific patient. UpToDate, Inc. and its affiliates disclaim any warranty or liability relating to this information or the use thereof. The use of this information is governed by the Terms of Use, available at https://www.wolterskluwer.com/en/know/clinical-effectiveness-terms Copyright Copyright ? 2023 UpToDate, Inc. and its affiliates and/or licensors. All rights reserved.

## 2022-04-15 NOTE — Anesthesia Pre-Procedure Evaluation
This is a 42 y.o. male scheduled for COLONOSCOPY, FLEXIBLE; DX, W/WO SPECIMENS/COLON DECOMP (SEP PROC).Review of Systems/ Medical HistoryPatient summary, nursing notes, EKG/Cardiac Studies , Labs, pre-procedure vitals, height, weight and NPO status reviewed.No previous anesthesia concernsAnesthesia Evaluation:   Estimated body mass index is 24.17 kg/m? as calculated from the following:  Height as of this encounter: 6' 1 (1.854 m).  Weight as of this encounter: 83.1 kg. CC/HPI: 42 YO male with hx of colon polyps and diverticulosis scheduled for -colonoscopy Past Surgical History:  Bone marrow donationUPPER GASTROINTESTINAL ENDOSCOPYCardiovascular: Patient has a history of: hypertension.   -Other Cardiovascular: pulmonary embolus.   Respiratory: -Lung Disorders: Patient has pulmonary embolus.  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 (anticardiolipin IgM antibody and beta-2 glycoprotein, IgM antibody positive with high titer, as well as lupus anticoagulin positive at the time of PE diagnosis on 02/20/2010 but negative in 5/12) as well as heterozygosity for FVL mutation.Marland KitchenMarland KitchenMarland KitchenOn xarelto since 2012Behavioral/Social/Psychiatric & Syndromes: Pt reports alcohol consumption.Physical ExamCardiovascular:    normal exam  Rhythm: regularHeart Sounds: S1 present and S2 present.Pulmonary:  normal exam  Patient's breath sounds clear to auscultationAirway:  Mallampati: IITM distance: >3 FBNeck ROM: fullDental:  unremarkable  Anesthesia PlanASA 3 The primary anesthesia plan is  general. Perioperative Code Status confirmed: It is my understanding that the patient is currently designated as 'Full Code' and will remain so throughout the perioperative period.Anesthesia informed consent obtained. Consent obtained from: patientThe post operative pain plan is per surgeon management.Plan discussed with CRNA.Anesthesiologist's Pre Op NoteI personally evaluated and examined the patient prior to the intra-operative phase of care on the day of the procedure.Marland Kitchen

## 2022-04-15 NOTE — Other
Operative Diagnosis:Pre-op:   * No pre-op diagnosis entered * Patient Coded Diagnosis   Pre-op diagnosis: History of colon polyps, History of diverticulosis  Post-op diagnosis: History of colon polyps, History of diverticulosis  Patient Diagnosis   None    Post-op diagnosis:   * History of colon polyps [Z86.010]   * History of diverticulosis [Z87.19]Operative Procedure(s) :Procedure(s) (LRB):COLONOSCOPY, FLEXIBLE; DX, W/WO SPECIMENS/COLON DECOMP (SEP PROC) (N/A)Post-op Procedure & Diagnosis ConfirmationPost-op Diagnosis: Post-op Diagnosis updated (see notes)     - Colon polyp times 2Post-op Procedure: Post-op Procedure confirmed (no changes)Anesthesia ClarifiersGI/Endoscopy: Planned Screening Colonoscopy - Procedure Performed (see above)

## 2022-04-15 NOTE — Anesthesia Post-Procedure Evaluation
Anesthesia Post-op NotePatient: Dalton Longs McLaughlinProcedure(s):  Procedure(s) (LRB):COLONOSCOPY, FLEXIBLE; DX, W/WO SPECIMENS/COLON DECOMP (SEP PROC) (N/A) Last Vitals:  I have reviewed the post-operative vital signs as noted in the Epic chart.POSTOP EVALUATION:      Patient Recovery Location:  PACU     Vital Signs Status:  Stable     Patient Participation:  Patient participated     Mental Status:  Awake, alert and oriented     Respiratory Status:  Acceptable     Airway Patency:  Patent     Cardiovascular/Hydration Status:  Stable     Pain Management:  Satisfactory to patient     Nausea/Vomiting Status:  Satisfactory to patientThere were no known notable events for this encounter.

## 2022-04-15 NOTE — Other
Colonoscopy performed under anesthesia. Polyps removed by forceps and cold snare

## 2022-04-15 NOTE — Other
Post Anesthesia Transfer of Care NotePatient: Dalton Longs McLaughlinProcedure(s) Performed: Procedure(s) (LRB):COLONOSCOPY, FLEXIBLE; DX, W/WO SPECIMENS/COLON DECOMP (SEP PROC) (N/A)Last Vitals: I have reviewed the post-operative vital signs during the handoff as noted in the Epic chart.POSTOP HANDOFF :      Patient Location:  PACU     Level of Consciousness:  Awake, alert and oriented     VS stable since last recorded intra-op set? Yes       Oxygen source: maskPatient co-morbidities, intra-operative course, intake & output and antibiotics as per Anesthesia record were discussed with the RN.

## 2022-04-16 ENCOUNTER — Encounter: Admit: 2022-04-16 | Payer: PRIVATE HEALTH INSURANCE | Attending: Family | Primary: Internal Medicine

## 2022-04-16 LAB — VITAMIN D 25 (VITAMIN D STATUS)(MINMETABLAB)(YH): VITAMIN D 25-HYDROXY: 36 ng/mL (ref 20–50)

## 2022-04-30 ENCOUNTER — Encounter: Admit: 2022-04-30 | Payer: PRIVATE HEALTH INSURANCE | Attending: Gastroenterology | Primary: Internal Medicine

## 2022-05-01 ENCOUNTER — Encounter: Admit: 2022-05-01 | Payer: PRIVATE HEALTH INSURANCE | Attending: Family | Primary: Internal Medicine

## 2022-06-22 LAB — AMB RESULTS CONSOLE CBG: Glucose: 131

## 2022-06-22 NOTE — Progress Notes (Signed)
PT. Attended health equity screening-[employer]. SDOH food resource given. 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

## 2022-07-26 NOTE — Progress Notes (Signed)
Pt attended screening event on 06/22/22, where screening results wnl. Pt shared he has PCP but can not remember and indicated food insecurities at the event. Pt was provided with food resources. Pt was contacted for PCP follow up.

## 2022-10-02 ENCOUNTER — Encounter: Payer: Self-pay | Admitting: *Deleted

## 2022-10-02 NOTE — Progress Notes (Signed)
Pt attended 06/22/22 screening event where his b/p was 120/74 and his blood sugar was 131. At the event, the pt indicated he had a PCP whose name he could not remember and noted a food insecurity, for which he was given resources at the event. During the initial event f/u, health equity team member was unable to contact pt to determine current PCP status so letter was sent with PCP options in case needed. During the event 60 day f/u, health equity team member again unable to contact pt by phone or leave vm to f/u about PCP status, so letter sent to pt with [facility] flyers and food resources, in case pt needs PCP access or has ongoing food needs.

## 2022-11-30 ENCOUNTER — Ambulatory Visit (HOSPITAL_COMMUNITY)
Admission: EM | Admit: 2022-11-30 | Discharge: 2022-11-30 | Disposition: A | Discharge status: Home / Self Care | Payer: 59 | Attending: Family Medicine | Admitting: Family Medicine

## 2022-11-30 ENCOUNTER — Encounter (HOSPITAL_COMMUNITY): Payer: Self-pay

## 2022-11-30 DIAGNOSIS — H1033 Unspecified acute conjunctivitis, bilateral: Secondary | ICD-10-CM | POA: Diagnosis not present

## 2022-11-30 MED ORDER — GENTAMICIN SULFATE 0.3 % OP SOLN: 2.0000 [drp] | Freq: Three times a day (TID) | OPHTHALMIC | 0 refills | Status: AC

## 2022-11-30 NOTE — ED Provider Notes (Signed)
[facility]    CSN: [id number] Arrival date & time: 11/30/22  1245      History   Chief Complaint Chief Complaint  Patient presents with   Conjunctivitis    HPI [name] is a 42 y.o. male.    Conjunctivitis  Here for redness and irritation and discharge from first his right eye and now his left eye is starting to bother him.  Symptoms began on November 7 and have become more pronounced since then and then this morning he noted his left eye to be irritated and red.  He is allergic to penicillin   Past Medical History:  Diagnosis Date   Achilles tendon rupture 08/21/2013   left    There are no problems to display for this patient.   Past Surgical History:  Procedure Laterality Date   ACHILLES TENDON SURGERY Left 08/27/2013   Procedure: REPAIR LEFT RUPTURED ACHILLES TENDON PRIMARY OPEN/PERCUTANEOUS ;  Surgeon: Sheral Apley, MD;  Location: [facility];  Service: Orthopedics;  Laterality: Left;   NO PAST SURGERIES         Home Medications    Prior to Admission medications   Medication Sig Start Date End Date Taking? Authorizing Provider  gentamicin (GARAMYCIN) 0.3 % ophthalmic solution Place 2 drops into both eyes 3 (three) times daily for 5 days. 11/30/22 12/05/22 Yes Zenia Resides, MD    Family History History reviewed. No pertinent family history.  Social History Social History   Tobacco Use   Smoking status: Every Day    Types: Cigars   Smokeless tobacco: Never  Vaping Use   Vaping status: Never Used  Substance Use Topics   Alcohol use: Yes    Comment: socially   Drug use: No     Allergies   Fish allergy and Penicillins   Review of Systems Review of Systems   Physical Exam Triage Vital Signs ED Triage Vitals  Encounter Vitals Group     BP 11/30/22 1258 116/68     Systolic BP Percentile --      Diastolic BP Percentile --      Pulse Rate 11/30/22 1258 65     Resp 11/30/22 1258 18     Temp  11/30/22 1258 98.4 F (36.9 C)     Temp Source 11/30/22 1258 Oral     SpO2 11/30/22 1258 100 %     Weight 11/30/22 1257 165 lb (74.8 kg)     Height 11/30/22 1257 5\' 11"  (1.803 m)     Head Circumference --      Peak Flow --      Pain Score 11/30/22 1254 0     Pain Loc --      Pain Education --      Exclude from Growth Chart --    No data found.  Updated Vital Signs BP 116/68 (BP Location: Right Arm)   Pulse 65   Temp 98.4 F (36.9 C) (Oral)   Resp 18   Ht 5\' 11"  (1.803 m)   Wt 74.8 kg   SpO2 100%   BMI 23.01 kg/m   Visual Acuity Right Eye Distance: 20/40 (Uncorrected) Left Eye Distance: 20/40 (Uncorrected) Bilateral Distance: 20/30 (Uncorrected)  Right Eye Near:   Left Eye Near:    Bilateral Near:     Physical Exam Vitals reviewed.  Constitutional:      General: He is not in acute distress.    Appearance: He is not ill-appearing, toxic-appearing or diaphoretic.  Eyes:     Extraocular Movements: Extraocular movements intact.     Pupils: Pupils are equal, round, and reactive to light.     Comments: There is pronounced injection of the right eye with some discharge in both the inner and outer canthi.  The left eye is mildly injected.  Skin:    Coloration: Skin is not pale.  Neurological:     Mental Status: He is alert and oriented to person, place, and time.  Psychiatric:        Behavior: Behavior normal.      UC Treatments / Results  Labs (all labs ordered are listed, but only abnormal results are displayed) Labs Reviewed - No data to display  EKG   Radiology No results found.  Procedures Procedures (including critical care time)  Medications Ordered in UC Medications - No data to display  Initial Impression / Assessment and Plan / UC Course  I have reviewed the triage vital signs and the nursing notes.  Pertinent labs & imaging results that were available during my care of the patient were reviewed by me and considered in my medical decision  making (see chart for details).     Gentamicin eyedrops are sent in to treat the conjunctivitis. Final Clinical Impressions(s) / UC Diagnoses   Final diagnoses:  Acute bacterial conjunctivitis of both eyes     Discharge Instructions      Put gentamicin eyedrops in the affected eye(s) 3 times daily for 5 days.  Cool compresses can help how your eyes feel.  You can also get some artificial tears or Systane or refresh regular eyedrops and keep them in the fridge and uses as needed for calming the irritation.      ED Prescriptions     Medication Sig Dispense Auth. Provider   gentamicin (GARAMYCIN) 0.3 % ophthalmic solution Place 2 drops into both eyes 3 (three) times daily for 5 days. 5 mL Zenia Resides, MD      PDMP not reviewed this encounter.   Zenia Resides, MD 11/30/22 1415

## 2022-11-30 NOTE — Discharge Instructions (Signed)
Put gentamicin eyedrops in the affected eye(s) 3 times daily for 5 days.  Cool compresses can help how your eyes feel.  You can also get some artificial tears or Systane or refresh regular eyedrops and keep them in the fridge and uses as needed for calming the irritation.

## 2022-11-30 NOTE — ED Triage Notes (Signed)
"  My right eye is red, watery, itchy and feeling irritate on Thursday but now is moved to both eyes". Some discharge from eye "clear". No pain in eye. No glasses or contacts. No visual changes "just sensitive to light".

## 2022-12-05 ENCOUNTER — Other Ambulatory Visit: Payer: Self-pay

## 2022-12-05 ENCOUNTER — Encounter (HOSPITAL_COMMUNITY): Payer: Self-pay | Admitting: Emergency Medicine

## 2022-12-05 ENCOUNTER — Ambulatory Visit (HOSPITAL_COMMUNITY)
Admission: EM | Admit: 2022-12-05 | Discharge: 2022-12-05 | Disposition: A | Discharge status: Home / Self Care | Payer: 59 | Attending: Internal Medicine | Admitting: Internal Medicine

## 2022-12-05 DIAGNOSIS — H1033 Unspecified acute conjunctivitis, bilateral: Secondary | ICD-10-CM | POA: Diagnosis not present

## 2022-12-05 MED ORDER — BACITRACIN-POLYMYXIN B 500-10000 UNIT/GM OP OINT: 1.0000 | TOPICAL_OINTMENT | Freq: Two times a day (BID) | OPHTHALMIC | 0 refills | Status: AC

## 2022-12-05 MED ORDER — OLOPATADINE HCL 0.1 % OP SOLN: 1.0000 [drp] | Freq: Two times a day (BID) | OPHTHALMIC | 0 refills | Status: AC

## 2022-12-05 NOTE — ED Triage Notes (Signed)
Pt c/o right eye problem. States he was diagnosed with pink eye but ran out of eye drops and feels like the other eye is now affected.

## 2022-12-05 NOTE — Discharge Instructions (Addendum)
Please use medication as directed If you develop double vision, worsening eye pain, eye discharge or worsening vision-please feel free to return to urgent care to be reevaluated.

## 2022-12-09 NOTE — ED Provider Notes (Signed)
[facility]    CSN: [id number] Arrival date & time: 12/05/22  1048      History   Chief Complaint No chief complaint on file.   HPI [name] is a 42 y.o. male comes to the urgent care with bilateral eye redness which has been persistent over the past few days.  Patient was recently seen in the urgent care and prescribed antibiotic eye drops.  Symptoms initially started with right eye and then spread to involve the left eye.  Patient complains of bilateral eye eye discharge which is yellowish in color.  No blurry vision.  No double vision.  No light sensitivity.  No trauma to the eyes.  Patient endorses sick contacts.  No nasal congestion, sore throat or cough.  No history of seasonal allergies.  No itchy eyes, no sore throat. HPI  Past Medical History:  Diagnosis Date   Achilles tendon rupture 08/21/2013   left    There are no problems to display for this patient.   Past Surgical History:  Procedure Laterality Date   ACHILLES TENDON SURGERY Left 08/27/2013   Procedure: REPAIR LEFT RUPTURED ACHILLES TENDON PRIMARY OPEN/PERCUTANEOUS ;  Surgeon: Sheral Apley, MD;  Location: [facility];  Service: Orthopedics;  Laterality: Left;   NO PAST SURGERIES         Home Medications    Prior to Admission medications   Medication Sig Start Date End Date Taking? Authorizing Provider  bacitracin-polymyxin b (POLYSPORIN) ophthalmic ointment Place 1 Application into both eyes every 12 (twelve) hours for 5 days. apply to eye every 12 hours while awake 12/05/22 12/10/22 Yes Eron Staat, Britta Mccreedy, MD  olopatadine (PATANOL) 0.1 % ophthalmic solution Place 1 drop into both eyes 2 (two) times daily. 12/05/22  Yes Destine Zirkle, Britta Mccreedy, MD    Family History History reviewed. No pertinent family history.  Social History Social History   Tobacco Use   Smoking status: Every Day    Types: Cigars   Smokeless tobacco: Never  Vaping Use   Vaping status: Never Used   Substance Use Topics   Alcohol use: Yes    Comment: socially   Drug use: No     Allergies   Fish allergy and Penicillins   Review of Systems Review of Systems As per HPI  Physical Exam Triage Vital Signs ED Triage Vitals [12/05/22 1137]  Encounter Vitals Group     BP 131/76     Systolic BP Percentile      Diastolic BP Percentile      Pulse Rate 68     Resp 15     Temp 98.1 F (36.7 C)     Temp Source Oral     SpO2 98 %     Weight      Height      Head Circumference      Peak Flow      Pain Score      Pain Loc      Pain Education      Exclude from Growth Chart    No data found.  Updated Vital Signs BP 131/76 (BP Location: Right Arm)   Pulse 68   Temp 98.1 F (36.7 C) (Oral)   Resp 15   SpO2 98%   Visual Acuity Right Eye Distance:   Left Eye Distance:   Bilateral Distance:    Right Eye Near:   Left Eye Near:    Bilateral Near:     Physical Exam Vitals  and nursing note reviewed.  Constitutional:      General: He is not in acute distress. HENT:     Right Ear: Tympanic membrane normal.     Left Ear: Tympanic membrane normal.     Nose: Nose normal.     Mouth/Throat:     Mouth: Mucous membranes are moist.     Pharynx: No oropharyngeal exudate or posterior oropharyngeal erythema.  Eyes:     Extraocular Movements: Extraocular movements intact.     Pupils: Pupils are equal, round, and reactive to light.     Comments: Bilateral conjunctival erythema.  Pupils are reactive to light  Neurological:     Mental Status: He is alert.      UC Treatments / Results  Labs (all labs ordered are listed, but only abnormal results are displayed) Labs Reviewed - No data to display  EKG   Radiology No results found.  Procedures Procedures (including critical care time)  Medications Ordered in UC Medications - No data to display  Initial Impression / Assessment and Plan / UC Course  I have reviewed the triage vital signs and the nursing  notes.  Pertinent labs & imaging results that were available during my care of the patient were reviewed by me and considered in my medical decision making (see chart for details).     1.  Acute bacterial conjunctivitis of both eyes: Polysporin eye ointment Patanol eyedrops Tylenol or ibuprofen as needed for pain and/or fever Return precautions given. Final Clinical Impressions(s) / UC Diagnoses   Final diagnoses:  Acute bacterial conjunctivitis of both eyes     Discharge Instructions      Please use medication as directed If you develop double vision, worsening eye pain, eye discharge or worsening vision-please feel free to return to urgent care to be reevaluated.   ED Prescriptions     Medication Sig Dispense Auth. Provider   bacitracin-polymyxin b (POLYSPORIN) ophthalmic ointment Place 1 Application into both eyes every 12 (twelve) hours for 5 days. apply to eye every 12 hours while awake 3.5 g Keir Viernes, Britta Mccreedy, MD   olopatadine (PATANOL) 0.1 % ophthalmic solution Place 1 drop into both eyes 2 (two) times daily. 5 mL Loring Liskey, Britta Mccreedy, MD      PDMP not reviewed this encounter.   Merrilee Jansky, MD 12/09/22 1236

## 2022-12-31 ENCOUNTER — Encounter: Payer: Self-pay | Admitting: *Deleted

## 2022-12-31 NOTE — Progress Notes (Signed)
Pt attended 06/22/22 screening event where his b/p was 120/74 and his blood sugar was 131. At the event, the pt noted he had a PCP but could not remember the name, had insurance, smoked, and had a food insecurity. During initial and 60 day event follow up, health equity team members unable to contact pt by phone and PCP and food resource info was mailed to the address he shared at the event. Chart review indicates pt has gone twice to the Urgent care for eye care since the event but no other [facility]-visible PCP encounters seen in chart. Today, during the 6 month event f/u call, pt answered the call but hung up when caller introduced self. Final letter with Get Care Now and community primary care clinic flyers and food resources list mailed to pt. No additional health equity team support scheduled at this time.

## 2023-02-03 ENCOUNTER — Encounter: Admit: 2023-02-03 | Payer: PRIVATE HEALTH INSURANCE | Attending: Internal Medicine | Primary: Internal Medicine

## 2023-02-04 MED ORDER — AMLODIPINE 10 MG TABLET
10 mg | ORAL_TABLET | Freq: Every day | ORAL | 4 refills | Status: AC
Start: 2023-02-04 — End: ?

## 2023-03-10 ENCOUNTER — Telehealth: Admit: 2023-03-10 | Payer: PRIVATE HEALTH INSURANCE | Attending: Internal Medicine | Primary: Internal Medicine

## 2023-03-12 MED ORDER — TRIAMTERENE 37.5 MG-HYDROCHLOROTHIAZIDE 25 MG TABLET
37.5-25 | ORAL_TABLET | Freq: Every day | ORAL | 1 refills | Status: AC
Start: 2023-03-12 — End: ?

## 2023-03-12 NOTE — Telephone Encounter
 Triamterene-HCTZ [facility] please schedule CPE w/ ME. Thanks

## 2023-03-15 ENCOUNTER — Encounter: Admit: 2023-03-15 | Payer: PRIVATE HEALTH INSURANCE | Attending: Hematology | Primary: Internal Medicine

## 2023-03-15 DIAGNOSIS — I2699 Other pulmonary embolism without acute cor pulmonale: Secondary | ICD-10-CM

## 2023-03-18 ENCOUNTER — Encounter: Admit: 2023-03-18 | Payer: PRIVATE HEALTH INSURANCE | Primary: Internal Medicine

## 2023-03-18 NOTE — Telephone Encounter
 Sent patient a MyChart message To schedule for a CPE with ME this year Will wait for response

## 2023-03-26 NOTE — Telephone Encounter
 Patient didn't read MyChart message Scheduled patient for a CPE with ME On May 8th at 3:30pmMailing an appointment letter to notify

## 2023-04-08 ENCOUNTER — Encounter: Admit: 2023-04-08 | Payer: PRIVATE HEALTH INSURANCE | Primary: Internal Medicine

## 2023-04-17 ENCOUNTER — Ambulatory Visit: Admit: 2023-04-17 | Payer: BLUE CROSS/BLUE SHIELD | Attending: Hematology and Oncology | Primary: Internal Medicine

## 2023-04-29 ENCOUNTER — Encounter: Admit: 2023-04-29 | Payer: PRIVATE HEALTH INSURANCE | Attending: Hematology | Primary: Internal Medicine

## 2023-04-29 DIAGNOSIS — I2699 Other pulmonary embolism without acute cor pulmonale: Principal | ICD-10-CM

## 2023-05-06 MED ORDER — RIVAROXABAN 20 MG TABLET
20 | ORAL_TABLET | Freq: Every day | ORAL | 5 refills | Status: AC
Start: 2023-05-06 — End: ?

## 2023-05-11 ENCOUNTER — Encounter: Admit: 2023-05-11 | Payer: PRIVATE HEALTH INSURANCE | Primary: Internal Medicine

## 2023-05-11 ENCOUNTER — Encounter: Admit: 2023-05-11 | Payer: PRIVATE HEALTH INSURANCE | Attending: Hematology and Oncology | Primary: Internal Medicine

## 2023-05-11 DIAGNOSIS — I2699 Other pulmonary embolism without acute cor pulmonale: Secondary | ICD-10-CM

## 2023-05-20 ENCOUNTER — Encounter: Admit: 2023-05-20 | Payer: PRIVATE HEALTH INSURANCE | Attending: Hematology and Oncology | Primary: Internal Medicine

## 2023-05-20 ENCOUNTER — Ambulatory Visit: Admit: 2023-05-20 | Payer: PRIVATE HEALTH INSURANCE | Attending: Hematology and Oncology | Primary: Internal Medicine

## 2023-05-20 VITALS — BP 115/75 | HR 84 | Temp 98.00000°F | Resp 20 | Ht 72.0 in | Wt 203.0 lb

## 2023-05-20 DIAGNOSIS — I2699 Other pulmonary embolism without acute cor pulmonale: Secondary | ICD-10-CM

## 2023-05-20 DIAGNOSIS — I7300 Raynaud's syndrome without gangrene: Secondary | ICD-10-CM

## 2023-05-20 DIAGNOSIS — F1020 Alcohol dependence, uncomplicated: Secondary | ICD-10-CM

## 2023-05-20 DIAGNOSIS — M109 Gout, unspecified: Secondary | ICD-10-CM

## 2023-05-20 DIAGNOSIS — E559 Vitamin D deficiency, unspecified: Secondary | ICD-10-CM

## 2023-05-20 DIAGNOSIS — I10 Essential (primary) hypertension: Secondary | ICD-10-CM

## 2023-05-20 DIAGNOSIS — D6861 Antiphospholipid syndrome: Secondary | ICD-10-CM

## 2023-05-20 MED ORDER — CHOLECALCIFEROL (VITAMIN D3) 50 MCG (2,000 UNIT) TABLET
50 | Freq: Every day | ORAL | 3.00 refills | 30.00000 days | Status: AC
Start: 2023-05-20 — End: ?
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
## 2023-05-29 ENCOUNTER — Encounter: Admit: 2023-05-29 | Payer: PRIVATE HEALTH INSURANCE | Attending: Internal Medicine | Primary: Internal Medicine

## 2023-06-02 ENCOUNTER — Encounter: Admit: 2023-06-02 | Payer: PRIVATE HEALTH INSURANCE | Attending: Internal Medicine | Primary: Internal Medicine

## 2023-06-04 MED ORDER — TRIAMTERENE 37.5 MG-HYDROCHLOROTHIAZIDE 25 MG TABLET
37.5-25 | ORAL_TABLET | Freq: Every day | ORAL | 1 refills | Status: AC
Start: 2023-06-04 — End: ?

## 2023-06-04 NOTE — Telephone Encounter
 [facility], please reschedule cancelled physical

## 2023-06-05 ENCOUNTER — Encounter: Admit: 2023-06-05 | Payer: PRIVATE HEALTH INSURANCE | Primary: Internal Medicine

## 2023-06-05 NOTE — Telephone Encounter
 Sent patient a MyChart message Asking to schedule a CPE with ME

## 2023-06-24 ENCOUNTER — Encounter: Admit: 2023-06-24 | Payer: PRIVATE HEALTH INSURANCE | Primary: Internal Medicine

## 2023-06-24 ENCOUNTER — Encounter: Admit: 2023-06-24 | Payer: PRIVATE HEALTH INSURANCE | Attending: Internal Medicine | Primary: Internal Medicine

## 2023-06-24 NOTE — Telephone Encounter
 Contact letter sent to patient to address on file

## 2023-08-26 ENCOUNTER — Encounter: Admit: 2023-08-26 | Payer: PRIVATE HEALTH INSURANCE | Attending: Internal Medicine | Primary: Internal Medicine

## 2023-08-27 MED ORDER — TRIAMTERENE 37.5 MG-HYDROCHLOROTHIAZIDE 25 MG TABLET
37.5-25 | ORAL_TABLET | Freq: Every day | ORAL | 1 refills | 90.00000 days | Status: AC
Start: 2023-08-27 — End: ?

## 2023-11-19 ENCOUNTER — Encounter: Admit: 2023-11-19 | Payer: PRIVATE HEALTH INSURANCE | Attending: Internal Medicine | Primary: Internal Medicine

## 2023-11-20 NOTE — Telephone Encounter [36]
 CC please schedule CPE w/ ME, then fwd to nsg for med refill. Thanks

## 2023-11-21 NOTE — Telephone Encounter [36]
 Called and LVM for patient to return call Need to schedule a CPE with ME's next available Can place on wait list if anything comes up sooner

## 2023-11-28 NOTE — Telephone Encounter [36]
 Called 2x and LVM for a return call To schedule a CPE with ME next available

## 2023-12-04 ENCOUNTER — Encounter: Admit: 2023-12-04 | Payer: PRIVATE HEALTH INSURANCE | Primary: Internal Medicine

## 2023-12-04 NOTE — Telephone Encounter [36]
 Mailing patient a contact letter To address on file to schedule an appointment

## 2023-12-31 ENCOUNTER — Encounter: Admit: 2023-12-31 | Payer: PRIVATE HEALTH INSURANCE | Attending: Internal Medicine | Primary: Internal Medicine

## 2024-01-01 ENCOUNTER — Telehealth: Admit: 2024-01-01 | Payer: PRIVATE HEALTH INSURANCE | Attending: Internal Medicine | Primary: Internal Medicine

## 2024-01-01 MED ORDER — AMLODIPINE 10 MG TABLET
10 | ORAL_TABLET | Freq: Every day | ORAL | 1 refills | 30.00000 days | Status: AC
Start: 2024-01-01 — End: ?

## 2024-01-01 MED ORDER — TRIAMTERENE 37.5 MG-HYDROCHLOROTHIAZIDE 25 MG TABLET
37.5-25 | ORAL_TABLET | Freq: Every day | ORAL | 1 refills | 60.00000 days | Status: AC
Start: 2024-01-01 — End: ?

## 2024-01-01 NOTE — Telephone Encounter [36]
 Covering. Will leave for ME's return.

## 2024-01-01 NOTE — Telephone Encounter [36]
 ME- please advise on scheduling this patient for a CPE with you

## 2024-01-01 NOTE — Telephone Encounter [36]
 Copied from CRM #[id number]. 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

## 2024-01-01 NOTE — Telephone Encounter [36]
 Copied from CRM #[id number]. Topic: Medication Refill - [facility] Med Refill>> Jan 01, 2024  8:22 AM Altamese SAUNDERS wrote:[facility] Connection to Care: Rx/MedicationTime of call:   8:22 AMCaller:  Marikay Pac Caller's relationship to patient:  Husband tim  Calling from (pharmacy, agency, etc.):  n/aSpecialist you are calling for:  naReason for call:  MEDICATION(S) REFILL:Did patient contact pharmacy and was told there are no refills remaining (if no, ask caller to call pharmacy and request electronic refill request):  N/AWhat is the name of medication? (Please copy/paste from Medication List for accuracy)  triamterene -hydroCHLOROthiazide  (MAXZIDE -25) Dosage and frequency?   37.5-25 mg per tablet Is the request for a new medication or a refill?   Refill, the medication was prescribed by our office.  The patient has called the pharmacy and the pharmacy instructed call office.   Confirmed w/ Caller Preferred PharmacyHas the patient been seen in the last 12 months?  NoIf no.SABRASABRAWas an appointment offered? yesIf yes... Patient Declined can only do TuesdaysHow many days of medication does patient have left (route as high priority if 3 business days or less):  Phone: [phone] Fax: [phone] name: [facility] [place], [place] - [address] [place] phone number: Phone: [phone] Fax: [phone] Scheduler

## 2024-01-09 NOTE — Telephone Encounter [36]
 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

## 2024-01-27 ENCOUNTER — Ambulatory Visit: Admit: 2024-01-27 | Payer: PRIVATE HEALTH INSURANCE | Attending: Family | Primary: Internal Medicine

## 2024-01-27 ENCOUNTER — Encounter: Admit: 2024-01-27 | Payer: PRIVATE HEALTH INSURANCE | Attending: Family | Primary: Internal Medicine

## 2024-01-27 VITALS — BP 118/71 | HR 62 | Temp 97.00000°F | Wt 200.0 lb

## 2024-01-27 DIAGNOSIS — M109 Gout, unspecified: Secondary | ICD-10-CM

## 2024-01-27 DIAGNOSIS — Z0000 Encounter for general adult medical examination without abnormal findings: Principal | ICD-10-CM

## 2024-01-27 DIAGNOSIS — I7300 Raynaud's syndrome without gangrene: Secondary | ICD-10-CM

## 2024-01-27 DIAGNOSIS — I10 Essential (primary) hypertension: Secondary | ICD-10-CM

## 2024-01-27 DIAGNOSIS — E559 Vitamin D deficiency, unspecified: Secondary | ICD-10-CM

## 2024-01-27 DIAGNOSIS — Z789 Other specified health status: Secondary | ICD-10-CM

## 2024-01-27 DIAGNOSIS — F1020 Alcohol dependence, uncomplicated: Secondary | ICD-10-CM

## 2024-01-27 DIAGNOSIS — K635 Polyp of colon: Secondary | ICD-10-CM

## 2024-01-27 DIAGNOSIS — D6861 Antiphospholipid syndrome: Secondary | ICD-10-CM

## 2024-01-27 DIAGNOSIS — I2699 Other pulmonary embolism without acute cor pulmonale: Principal | ICD-10-CM

## 2024-01-27 DIAGNOSIS — M8700 Idiopathic aseptic necrosis of unspecified bone: Secondary | ICD-10-CM

## 2024-01-27 MED ORDER — AMLODIPINE 10 MG TABLET
10 | ORAL_TABLET | Freq: Every day | ORAL | 4 refills | 30.00000 days | Status: AC
Start: 2024-01-27 — End: ?

## 2024-01-27 MED ORDER — TRIAMTERENE 37.5 MG-HYDROCHLOROTHIAZIDE 25 MG TABLET
37.5-25 | ORAL_TABLET | Freq: Every day | ORAL | 4 refills | 90.00000 days | Status: AC
Start: 2024-01-27 — End: ?

## 2024-01-27 NOTE — Progress Notes [1]
 [facility]  Program Director: Donnice RAMAN. Honor, MDYetunde Asiedu, MD Sula Maid, MD Hart Pagoda, MD Jeanetta Sit, MD Maude Lever, MD, MPH Aleck Hasten, PhD Geni Hopping, MD Harlene Mannan, APRN 				 Name:Dalton McLaughlinDOB:  1980/05/26 MRN:  [id number] History of Present Illness:Dr. 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 He has a history of polyps and underwent a colonoscopy in 2024, which revealed low-risk polyps. He is on a five-year follow-up schedule, with the next colonoscopy due in 2029.He has antiphospholipid antibody syndrome and continues to see hematology annually, with the last visit in April. He is currently on Xarelto , and the condition has been stable for three to four years.He follows a vegetarian diet, which he adopted over four years ago to help manage his blood pressure. He does not consume alcohol due to past issues and incorporates physical activity into his daily routine by parking further away and taking stairs. He experiences intermittent hip pain, particularly after activities like shoveling snow or walking long distances. He has been told he has bilateral avascular necrosis based on imaging studies. He has not seen an orthopedist yet, despite a previous referral, but agrees to see one now.He reports occasional trouble sleeping due to an overactive mind, which he manages with a low dose of THC (5 mg) at night. He has tried melatonin in the past but found it made him feel too groggy.No chest pain, trouble breathing, or significant changes in bowel movements. He reports adequate fiber intake and stable dental and eye health.   There are no preventive care reminders to display for this patient. 2029 - next C scopeGlasses - up to dateDental - has routine visitsHearing - okDiet - no meatNo EtohExercise - walking, takes stairsImmunization History Administered Date(s) Administered  COVID-19 Vaccine - PFIZER 01/08/2019, 01/27/2019  COVID-19, MODERNA 12Y+, 0.5 mL 08/27/2019, 09/24/2019  COVID-19, MODERNA Bivalent, 12 Yrs+, 0.5 mL 09/28/2020  COVID-19, Moderna SPIKEVAX, 74yr + up, 50 mcg/0.5 mL 10/27/2021  COVID-19, Pfizer, 58yr + up, 30 mcg/0.3 mL 11/09/2022, 10/25/2023  Influenza, split virus, trivalent, Preservative Free 11/01/2011, 10/21/2020  Influenza, trivalent, injectable, contains preservative 10/22/2018, 10/22/2019  Influenza, unspecified formulation 11/09/2009  TB Screening (PPD/Quantiferon) 08/18/2012  Tdap 07/04/2015 Problem List:Patient Active Problem List Diagnosis  Bilateral pulmonary embolism (HC Code)  (HC CODE)  Hypertension  Gout  Alcohol use disorder, severe, in sustained remission  (HC CODE)  Vitamin D deficiency  Raynaud phenomenon  Colon polyp Family History Problem Relation Age of Onset  Hypertension Mother   Cancer Father   Gout Brother   Alcohol abuse Brother       now sober  Other (data conversion) Brother       optic neuritis  Rheumatoid arthritis Brother   Cancer Maternal Grandmother   Diabetes Maternal Grandmother   Hypertension Maternal Grandmother   Thyroid disease Maternal Grandmother   Hypertension Maternal Grandfather   Alcohol abuse Maternal Grandfather   Diabetes Paternal Grandmother   Cataracts Paternal Grandmother   Hypertension Paternal Grandmother   Alcohol abuse Paternal Grandmother   Cancer Paternal Grandfather   Hypertension Paternal Grandfather   Medical History:He  has a past medical history of Alcoholism (HC Code), Antiphospholipid antibody syndrome (HC Code) (HC CODE), Bilateral pulmonary embolism (HC Code) (jan 2012), Gout, Hypertension, Raynaud phenomenon (02/18/2020), and Vitamin D deficiency (02/18/2020).Allergies:He has no known allergies.Medications:Current Outpatient Medications Medication Sig Dispense Refill  amLODIPine  (NORVASC ) 10 mg tablet TAKE 1 TABLET (10 MG TOTAL)  BY MOUTH DAILY. 90 tablet 0  cholecalciferol , vitamin D3, 50 mcg (2,000 unit) tablet Take 1 tablet (2,000 Units total) by mouth daily.    rivaroxaban  (XARELTO ) 20 mg tablet Take 1 tablet (20 mg total) by mouth Daily @1700 . 90 tablet 4  triamterene -hydroCHLOROthiazide  (MAXZIDE -25) 37.5-25 mg per tablet Take 1 tablet by mouth daily. 90 tablet 0 No current facility-administered medications for this visit. Social History:He  reports that he has never smoked. He has never used smokeless tobacco. He reports that he does not drink alcohol and does not use drugs.Review of SystemsSee HPI.Otherwise neg. Little interest or pleasure in doing things: Not at allFeeling down, depressed, irritable, or hopeless?: Not at allPHQ-2 Total Score: 0Trouble falling or staying asleep, or sleeping too much: Not at allFeeling tired or having little energy: Not at allPoor appetite, weight loss, or overeating?: Not at allFeeling bad about yourself - or that you are a failure or have let yourself or your family down: Not at allTrouble concentrating on things, such as reading the newspaper or watching television: Not at [facility] or speaking so slowly that other people could have noticed. Or the opposite - being so fidgety or restless that you have been moving around a lot more than usual: Not at allThoughts that you would be better off dead, or of hurting yourself in some way: Not at allPHQ-9 Total Score: 0PHQ-2 Total Score: 0Generalized Anxiety Disorder GAD 2/7Feeling nervous, anxious or on edge: 0Not being able to stop or control worrying: 0GAD 2 Total Score: 0Worrying too much about different things: 0Trouble relaxing: 0Being so restless that it is hard to sit still: 0Becoming easily annoyed or irritable: 0Feeling afraid as if something awful might happen: 0Generalized Anxiety Disorder (GAD-7) Total Score: 0 Physical Examination: BP 118/71  - Pulse 62  - Temp 97 ?F (36.1 ?C)  - Wt 90.7 kg  - SpO2 98%  - BMI 27.12 kg/m? 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 HypertensionWell-controlled with stable blood pressure readings.- Continue current antihypertensive medications: Amlodipine  and Triamterene -hydrochlorothiazide .Antiphospholipid antibody syndromeManaged with Rivaroxaban . No recent thrombotic events.- Continue Rivaroxaban  20 mg oral daily.Avascular necrosis of bilateral hipsIntermittent pain with activity. No significant progression of pain. Possible link to antiphospholipid antibody syndrome.- Referred to orthopedic specialist for further evaluation and management.Vitamin D deficiencyManaged with supplementation. No recent symptoms of deficiency.- Continue current vitamin D supplementation.General Health MaintenanceUp to date with vaccinations and screenings. Regular check-ups maintained. Healthy lifestyle observed.- Ordered lipid panel, thyroid function tests, and B12. His hematologist has ordered CMP and CBC.- Continue routine health maintenance and screenings as scheduled. Return in 1 yr or sooner if needed. 1. Routine health maintenance  Lipid panel  TSH w/reflex to FT4     ([facility] LMW Q YH)  2. Hypertension, unspecified type  triamterene -hydroCHLOROthiazide  (MAXZIDE -25) 37.5-25 mg per  tablet  amLODIPine  (NORVASC ) 10 mg tablet  3. Vegetarian diet  Vitamin B12  4. Avascular necrosis (HC Code)  (HC CODE)  Ambulatory referral to Orthopaedic Surgery  5. Polyp of colon, unspecified part of colon, unspecified type    Electronically signed by Harlene Mannan, MSN, FNP-BC, MPHI provided a concise overview of the ambient note generation solution. [employer] or their legally authorized representative verbally consented to a temporary audio recording of their visit to assist with completing the visit documentation using an AI-powered solution. This note was reviewed for accuracy by Harlene Mannan who performed the clinical service.

## 2024-05-18 ENCOUNTER — Ambulatory Visit: Admit: 2024-05-18 | Payer: PRIVATE HEALTH INSURANCE | Attending: Hematology and Oncology | Primary: Internal Medicine
